# Patient Record
Sex: Female | Born: 1950 | Race: White | Hispanic: No | Marital: Married | Smoking: Never smoker
Health system: Southern US, Community
[De-identification: ages and names within clinical notes are randomized; demographics above are authoritative.]

## PROBLEM LIST (undated history)

## (undated) DIAGNOSIS — K219 Gastro-esophageal reflux disease without esophagitis: Secondary | ICD-10-CM

## (undated) DIAGNOSIS — I340 Nonrheumatic mitral (valve) insufficiency: Secondary | ICD-10-CM

## (undated) DIAGNOSIS — I493 Ventricular premature depolarization: Secondary | ICD-10-CM

## (undated) DIAGNOSIS — I071 Rheumatic tricuspid insufficiency: Secondary | ICD-10-CM

## (undated) DIAGNOSIS — H409 Unspecified glaucoma: Secondary | ICD-10-CM

## (undated) HISTORY — DX: Unspecified glaucoma: H40.9

## (undated) HISTORY — DX: Ventricular premature depolarization: I49.3

---

## 2004-09-05 ENCOUNTER — Ambulatory Visit: Payer: Self-pay | Admitting: General Surgery

## 2008-09-18 ENCOUNTER — Ambulatory Visit: Payer: Self-pay | Admitting: Internal Medicine

## 2010-05-08 ENCOUNTER — Ambulatory Visit: Payer: Self-pay | Admitting: Gastroenterology

## 2010-07-18 ENCOUNTER — Ambulatory Visit: Payer: Self-pay | Admitting: Gastroenterology

## 2010-07-22 LAB — PATHOLOGY REPORT

## 2012-05-17 ENCOUNTER — Ambulatory Visit: Payer: Self-pay | Admitting: Gastroenterology

## 2012-08-08 ENCOUNTER — Observation Stay: Payer: Self-pay | Admitting: Internal Medicine

## 2012-08-08 LAB — URINALYSIS, COMPLETE
Bacteria: NONE SEEN
Bilirubin,UR: NEGATIVE
Blood: NEGATIVE
Glucose,UR: NEGATIVE mg/dL
Ketone: NEGATIVE
Leukocyte Esterase: NEGATIVE
Nitrite: NEGATIVE
Ph: 9
Protein: NEGATIVE
RBC,UR: 4 /HPF
Specific Gravity: 1.014
Squamous Epithelial: <1
WBC UR: 1 /HPF

## 2012-08-08 LAB — BASIC METABOLIC PANEL
Anion Gap: 8 (ref 7–16)
Calcium, Total: 9.2 mg/dL (ref 8.5–10.1)
Chloride: 108 mmol/L — ABNORMAL HIGH (ref 98–107)
Co2: 27 mmol/L (ref 21–32)
EGFR (African American): >60
EGFR (Non-African Amer.): >60
Glucose: 98 mg/dL (ref 65–99)
Osmolality: 284 (ref 275–301)
Sodium: 143 mmol/L (ref 136–145)

## 2012-08-08 LAB — CBC WITH DIFFERENTIAL/PLATELET
Basophil #: 0 10*3/uL (ref 0.0–0.1)
Basophil %: 0.8 %
HCT: 40.4 % (ref 35.0–47.0)
HGB: 13.5 g/dL (ref 12.0–16.0)
MCH: 29.5 pg (ref 26.0–34.0)
Monocyte #: 0.3 x10 3/mm (ref 0.2–0.9)
Monocyte %: 5.6 %
Neutrophil #: 4.1 10*3/uL (ref 1.4–6.5)
RBC: 4.57 10*6/uL (ref 3.80–5.20)
RDW: 13 % (ref 11.5–14.5)
WBC: 6 10*3/uL (ref 3.6–11.0)

## 2012-08-08 LAB — CK TOTAL AND CKMB (NOT AT ARMC)
CK, Total: 76 U/L (ref 21–215)
CK, Total: 68 U/L
CK-MB: <0.5 ng/mL — ABNORMAL LOW (ref 0.5–3.6)
CK-MB: <0.5 ng/mL — ABNORMAL LOW

## 2012-08-08 LAB — TROPONIN I: Troponin-I: <0.02 ng/mL

## 2012-08-08 LAB — MAGNESIUM: Magnesium: 2 mg/dL

## 2012-08-08 LAB — TSH: Thyroid Stimulating Horm: 3.66 u[IU]/mL

## 2012-08-09 LAB — LIPID PANEL
Cholesterol: 195 mg/dL (ref 0–200)
HDL Cholesterol: 78 mg/dL — ABNORMAL HIGH (ref 40–60)
Ldl Cholesterol, Calc: 109 mg/dL — ABNORMAL HIGH (ref 0–100)
Triglycerides: 41 mg/dL (ref 0–200)
VLDL Cholesterol, Calc: 8 mg/dL (ref 5–40)

## 2012-08-09 LAB — CK TOTAL AND CKMB (NOT AT ARMC): CK, Total: 61 U/L (ref 21–215)

## 2012-10-13 ENCOUNTER — Ambulatory Visit: Payer: Self-pay | Admitting: Chiropractic Medicine

## 2014-07-16 ENCOUNTER — Encounter: Payer: Self-pay | Admitting: Podiatry

## 2014-07-16 ENCOUNTER — Ambulatory Visit (INDEPENDENT_AMBULATORY_CARE_PROVIDER_SITE_OTHER): Payer: BC Managed Care – PPO | Admitting: Podiatry

## 2014-07-16 VITALS — BP 125/73 | HR 86 | Resp 16 | Ht 63.0 in | Wt 145.0 lb

## 2014-07-16 DIAGNOSIS — L603 Nail dystrophy: Secondary | ICD-10-CM

## 2014-07-16 NOTE — Progress Notes (Signed)
   Subjective:    Patient ID: Courtney Galloway, female    DOB: May 12, 1951, 64 y.o.   MRN: [id number]  HPI Comments: i have a fungus on my big toe left foot. This has been going on for 4 - 6 weeks. i want him to look at my other toes as well. Once in a while the big toe will hurt if you mash on it. i soaked in epsom salt. i took lamisil by dr Al Corpushyatt yrs ago for the same toe.     Review of Systems  HENT:       Ringing in ears  Skin:       Change in nails  All other systems reviewed and are negative.      Objective:   Physical Exam: I have reviewed her past mental history medications allergies surgery social history and review of systems. Pulses are strongly palpable bilateral. Neurologic sensorium are intact per Semmes-Weinstein monofilament. Deep tendon reflexes are intact bilaterally muscle strength is 5 over 5 dorsiflexion plantar flexors and inverters everters all intrinsic musculature is intact. Orthopedic evaluation demonstrates all joints distal to the ankle for range of motion without crepitation. Cutaneous evaluation demonstrates supple well-hydrated cutis left foot with exception of mild tinea pedis. She also demonstrates a yellow green discoloration of the hallux nail plate with distal onychomycosis left hallux. Onychodystrophy second digit left foot is also noted.        Assessment & Plan:  Assessment: Tinea pedis with possible onychomycosis and onychodystrophy.  Plan: Samples of nails and skin were taken today for pathologic evaluation we will notify her once the results are obtained.

## 2014-07-25 ENCOUNTER — Encounter: Payer: Self-pay | Admitting: Podiatry

## 2014-07-25 ENCOUNTER — Telehealth: Payer: Self-pay | Admitting: Podiatry

## 2014-07-25 NOTE — Telephone Encounter (Signed)
CALLED AND LEFT MESSAGE FOR PATIENT TO CALL BACK TO SET UP APPT WITH DR.HYATT TO GO OVER PATHOLOGY RESULTS

## 2014-08-01 ENCOUNTER — Ambulatory Visit (INDEPENDENT_AMBULATORY_CARE_PROVIDER_SITE_OTHER): Payer: BC Managed Care – PPO | Admitting: Podiatry

## 2014-08-01 VITALS — BP 106/66 | HR 85 | Resp 16

## 2014-08-01 DIAGNOSIS — Z79899 Other long term (current) drug therapy: Secondary | ICD-10-CM

## 2014-08-01 MED ORDER — TERBINAFINE HCL 250 MG PO TABS: 250.0000 mg | ORAL_TABLET | Freq: Every day | ORAL | Status: DC

## 2014-08-02 LAB — CBC WITH DIFFERENTIAL/PLATELET
BASOS ABS: 0.1 10*3/uL (ref 0.0–0.2)
Basos: 1 %
EOS: 3 %
Eosinophils Absolute: 0.2 10*3/uL (ref 0.0–0.4)
HEMATOCRIT: 39.6 % (ref 34.0–46.6)
Hemoglobin: 13.3 g/dL (ref 11.1–15.9)
Immature Grans (Abs): 0 10*3/uL (ref 0.0–0.1)
Immature Granulocytes: 0 %
LYMPHS: 32 %
Lymphocytes Absolute: 2 10*3/uL (ref 0.7–3.1)
MCH: 28.9 pg (ref 26.6–33.0)
MCHC: 33.6 g/dL (ref 31.5–35.7)
MCV: 86 fL (ref 79–97)
MONOCYTES: 9 %
MONOS ABS: 0.5 10*3/uL (ref 0.1–0.9)
Neutrophils Absolute: 3.4 10*3/uL (ref 1.4–7.0)
Neutrophils Relative %: 55 %
Platelets: 372 10*3/uL (ref 150–379)
RBC: 4.6 x10E6/uL (ref 3.77–5.28)
RDW: 13.6 % (ref 12.3–15.4)
WBC: 6.2 10*3/uL (ref 3.4–10.8)

## 2014-08-02 LAB — HEPATIC FUNCTION PANEL
ALT: 30 IU/L (ref 0–32)
AST: 26 IU/L (ref 0–40)
Albumin: 4.6 g/dL (ref 3.6–4.8)
Alkaline Phosphatase: 73 IU/L (ref 39–117)
BILIRUBIN DIRECT: 0.09 mg/dL (ref 0.00–0.40)
Total Bilirubin: 0.3 mg/dL (ref 0.0–1.2)
Total Protein: 6.9 g/dL (ref 6.0–8.5)

## 2014-08-02 NOTE — Progress Notes (Signed)
She presents today for positive pathology report for onychomycosis. At this point she would like to try oral therapy.   Objective: Onychomycosis.  Assessment: Onychomycosis.  Plan: Started her on oral Lamisil therapy today after thorough discussion of pros and cons of the use of this medication. Lamisil 250 mg tablets 30 one by mouth daily and a liver profile was requested. Should this come back abnormal we will notify her immediately otherwise I will follow up with her in a little more than 1 month.

## 2014-08-06 ENCOUNTER — Telehealth: Payer: Self-pay | Admitting: *Deleted

## 2014-08-06 NOTE — Telephone Encounter (Signed)
Blood work good to continue with medication .

## 2014-08-06 NOTE — Telephone Encounter (Signed)
-----   Message from Elinor ParkinsonMax T Hyatt, [place]PM sent at 08/04/2014 10:23 AM EST ----- Blood work looks good and may continue medication.

## 2014-09-10 ENCOUNTER — Encounter: Payer: Self-pay | Admitting: Podiatry

## 2014-09-10 ENCOUNTER — Ambulatory Visit (INDEPENDENT_AMBULATORY_CARE_PROVIDER_SITE_OTHER): Payer: BC Managed Care – PPO | Admitting: Podiatry

## 2014-09-10 DIAGNOSIS — L603 Nail dystrophy: Secondary | ICD-10-CM

## 2014-09-10 DIAGNOSIS — Z79899 Other long term (current) drug therapy: Secondary | ICD-10-CM

## 2014-09-10 MED ORDER — TERBINAFINE HCL 250 MG PO TABS: 250.0000 mg | ORAL_TABLET | Freq: Every day | ORAL | Status: DC

## 2014-09-10 NOTE — Progress Notes (Signed)
She presents today one month status post her initial start of Lamisil. Her initial labs were very good she brings with her a second set of labs that were just performed over the last couple of days. She denies fever chills nausea vomiting muscle aches pains rashes or itching with the Lamisil.  Objective: No change in the nail plates as of yet.  Assessment: Onychomycosis long-term use of Lamisil for treatment plan.  Plan: She will continue Lamisil for another 90 days. I will follow up with her in 4 months

## 2014-10-18 LAB — HM COLONOSCOPY

## 2014-10-26 NOTE — Discharge Summary (Signed)
PATIENT NAME:  Courtney AbideWILLIAMS, Anarely N MR#:  [id number] DATE OF BIRTH:  04-08-1951  DATE OF ADMISSION:  08/08/2012 DATE OF DISCHARGE:  08/09/2012  PRIMARY CARE PHYSICIAN:  Katherina Rightenny C. Arlana Pouchate, MD  DISCHARGE DIAGNOSES:   1.  Musculoskeletal chest pain.  2.  Palpitations.   CONSULTS:  Dr. Darrold JunkerParaschos of cardiology.   IMAGING STUDIES DONE:   1.  Include chest x-ray which showed no acute abnormalities.  2.  Myocardial stress test which showed normal left ventricular function with normal wall motion. No signs of ischemia. Ejection fraction is 25%.   ADMITTING HISTORY AND PHYSICAL AND HOSPITAL COURSE:  A 64 year old female patient who has GERD and osteoporosis being worked up as outpatient for some palpitations over a month. Developed left-sided pain and tingling and was admitted to the hospitalist service for concern of acute coronary syndrome. The patient had 3 sets of negative cardiac enzymes and had a stress test done which was negative. She was seen by Dr. Darrold JunkerParaschos who has advised followup as outpatient. The patient has not had any palpitations in the hospital. A recent Holter monitor ran as an outpatient was normal as per the patient.   On the day prior to discharge, the patient's temperature was 98.9, pulse 74, blood pressure 130/74 and is being discharged home in fair condition to follow up with Dr. Darrold JunkerParaschos and primary care physician.   DISCHARGE MEDICATIONS:   1.  Protonix 40 mg oral once a day.  2.  Boniva 150 mg oral once a month.  3.  Multivitamin 1 tablet oral once a day.  4.  Fish 2 capsules oral once a day.  5.  Calcium carbonate 1 tablet oral once a day.  6.  Aspirin 81 mg oral once a day.   DISCHARGE INSTRUCTIONS:  The patient is being discharged home on a regular diet, activity as tolerated, follow up with Dr. Darrold JunkerParaschos in 2 to 4 weeks and Dr. Arlana Pouchate in a week.   TIME SPENT ON DAY OF DISCHARGE IN DISCHARGE ACTIVITY:  25 minutes.    ____________________________ Molinda BailiffSrikar R. Dray Dente,  MD srs:si D: 08/09/2012 15:17:00 ET T: 08/09/2012 16:41:36 ET JOB#: [id number]  cc: Wardell HeathSrikar R. Abeeha Twist, MD, <Dictator> Jillene Bucksenny C. Arlana Pouchate, MD Marcina MillardAlexander Paraschos, MD  Orie FishermanSRIKAR R Dustie Brittle MD ELECTRONICALLY SIGNED 08/11/2012 13:27

## 2014-10-26 NOTE — Consult Note (Signed)
PATIENT NAME:  Courtney Galloway, Courtney Galloway MR#:  [id number] DATE OF BIRTH:  [birth year]  DATE OF CONSULTATION:  08/09/2012  REFERRING PHYSICIAN:  Auburn BilberryShreyang Patel, MD   CONSULTING PHYSICIAN:  Marcina MillardAlexander Cashtyn Pouliot, MD PRIMARY CARE PHYSICIAN: Dewaine Oatsenny Tate, MD   CHIEF COMPLAINT: Chest discomfort.   REASON FOR CONSULTATION: Requested for evaluation of chest pain.   HISTORY OF PRESENT ILLNESS: The patient is a 64 year old female who is referred for evaluation of chest discomfort. The patient reports a 6660-month history of apparent palpitations that typically last 30 to 45 minutes. The patient recently was referred to Dr. Lady GaryFath for Holter monitor, which reportedly was negative. The patient had persistent symptoms with associated chest discomfort and [facility] 08[facility]08/2012. EKG was negative. The patient ruled out for myocardial infarction by CPK isoenzymes and troponin.   PAST MEDICAL HISTORY: 1. Gastroesophageal reflux disease.  2. Osteoporosis.   MEDICATIONS: Protonix 40 mg daily, Boniva 150 mg daily, calcium carbonate 1000 mg daily, fish oil caps 2 caps daily.   SOCIAL HISTORY: The patient denies tobacco or EtOH abuse.   FAMILY HISTORY: No immediate family history of coronary artery disease or myocardial infarction.   REVIEW OF SYSTEMS:  CONSTITUTIONAL: No fever or chills.  EYES: No blurry vision.  EARS: No hearing loss.  RESPIRATORY: No shortness of breath.  CARDIOVASCULAR: Chest discomfort and palpitations as described above.  GASTROINTESTINAL: No nausea, vomiting, diarrhea or constipation.  GU: No dysuria or hematuria.  ENDOCRINE: No polyuria or polydipsia.  INTEGUMENTARY: No rash.  MUSCULOSKELETAL: No arthralgias or myalgias.  NEUROLOGICAL: No focal muscle weakness or numbness.  PSYCHOLOGICAL: No depression or anxiety.   PHYSICAL EXAMINATION: VITAL SIGNS: Blood pressure 145/69, pulse 78, respirations 16, temperature 96.8, pulse ox 99%.  HEENT: Pupils are equal and reactive  to light and accommodation.  NECK: Supple without thyromegaly.  LUNGS: Clear.  HEART: Normal JVP. Normal PMI. Regular rate and rhythm. Normal S1, S2. No appreciable gallop, murmur, or rub.  ABDOMEN: Soft and nontender. Pulses were intact bilaterally.  MUSCULOSKELETAL: Normal muscle tone.  NEUROLOGICAL: The patient is alert and oriented x 3. Motor and sensory are both grossly intact.   IMPRESSION: The patient is a 64 year old female with a 7260-month history of palpitations and recent history of chest discomfort with typical and atypical features. The patient has ruled out for myocardial infarction by CPK isoenzymes and troponin.   RECOMMENDATIONS: 1. I agree with overall current therapy.  2. I would defer full-dose anticoagulation.  3. I agree with ETT sestamibi study for today.   Further recommendations pending sestamibi results.   ____________________________ Marcina MillardAlexander Zoelle Markus, MD ap:cb D: 08/09/2012 12:36:02 ET T: 08/09/2012 12:47:49 ET JOB#: [id number]  cc: Marcina MillardAlexander Prisca Gearing, MD, <Dictator> Marcina MillardALEXANDER Cher Franzoni MD ELECTRONICALLY SIGNED 08/30/2012 14:57

## 2014-10-26 NOTE — H&P (Signed)
PATIENT NAME:  Courtney Galloway, [name] MR#:  [id number] DATE OF BIRTH:  01-10-1951  DATE OF ADMISSION:  08/08/2012  PRIMARY CARE PHYSICIAN: Katherina Rightenny C. Arlana Pouchate, MD  ED REFERRING PHYSICIAN: Darien Ramusavid W. Kaminski, MD  REASON FOR ADMISSION:  Chest pain, palpitations.     HISTORY OF PRESENT ILLNESS: The patient is a 64 year old white female with history of GERD and osteoporosis, who has been has been relatively healthy, who has been having heart palpitations for the past 1 month. She was seen by Dr. Arlana Pouchate for this and was referred to Dr. Lady GaryFath and had a Holter monitor placed, which apparently was negative. The patient continued to have these symptoms. She reports they usually last 1/2 hour, sometimes they last 1/2 hour to 45 minutes. It usually occurs at night time. She reports that she continued to have these symptoms, did not have any shortness of breath and did not have chest pain, but today she started having left arm pain and then started having numbness and tingling in the left arm. She also had a brief episode of substernal chest pain; therefore,  came to the ED. In the ED, she was noted to have a normal EKG that was unrevealing and her cardiac enzymes were negative. The patient currently is asymptomatic. She otherwise denies any fevers, chills, no shortness of breath.   PAST MEDICAL HISTORY: 1.  GERD.  2.  Osteoporosis.   PAST SURGICAL HISTORY: 1.  Status post hysterectomy.  2.  Tonsillectomy.   ALLERGIES: TO AMOXICILLIN.   CURRENT MEDICATIONS: She is on Protonix 40 daily, Boniva 150 q. monthly, calcium carbonate 1000 mg daily, fish oil 2 caps daily, multivitamin daily.   SOCIAL HISTORY: Does not smoke. Does not drink. No drugs.   FAMILY HISTORY: Father with CVA and pancreatic cancer.   REVIEW OF SYSTEMS:   CONSTITUTIONAL: Denies any fevers, chills. No weight loss. No weight gain.  EYES: Denies any blurred vision, double vision. No glaucoma. No cataracts.  ENT: Denies any nasal drainage. No  seasonal allergies.  OROPHARYNX:  Denies any difficulty with swallowing. No lesions in the mouth.  CARDIOVASCULAR: Palpitations as above. No syncope. No coronary artery disease. No history of hypertension.  PULMONARY: Denies any cough, wheezing. No hemoptysis. No COPD, no tuberculosis.  GASTROINTESTINAL: Denies any nausea, vomiting, diarrhea. Denies any hematemesis, hematochezia.  GENITOURINARY: Denies any frequency, urgency, or hesitancy.  MUSCULOSKELETAL: Denies any joint swelling. No gout.  NEUROLOGIC: Denies any numbness, CVA, TIA.  PSYCHIATRIC: Denies any anxiety, insomnia. No depression.  LYMPHATICS: Denies any lymph node enlargements.  NEUROLOGIC: Denies any CVA, TIA or seizures.   PHYSICAL EXAMINATION: VITAL SIGNS: Temperature 98.9, pulse 77, respirations 20, blood pressure 181/95.  GENERAL: The patient is awake, alert, oriented x3. No focal deficits. Cranial nerves II through XII grossly intact.  NOSE:  Nasal exam shows no drainage or ulceration.  OROPHARYNX: Clear without any exudate.  NECK: No thyromegaly. No carotid bruits.  CARDIOVASCULAR: Regular rate and rhythm. No murmurs, rubs, clicks, or gallops. PMI is not displaced.  LUNGS: Clear to auscultation bilaterally without any rales, rhonchi or wheezing.  ABDOMEN: Soft, nontender, nondistended. Positive bowel sounds x4.  EXTREMITIES: No clubbing, cyanosis, or edema.  SKIN: No rash.  LYMPHATICS: No lymph nodes palpable.  VASCULAR: Good DP, PT pulses.  PSYCHIATRIC: Not anxious or depressed.   LABORATORY, DIAGNOSTIC, AND RADIOLOGICAL DATA: BMP: Glucose 98, BUN 10, creatinine 0.69, sodium 143, potassium 4.1, chloride 108, CO2 27, magnesium 2.0. CPK 76. CK-MB less than 0.5. Troponin less than 0.02.  TSH 3.66, WBC 6.0, hemoglobin 13.5, platelet count 282.   ASSESSMENT AND PLAN: The patient is a 64 year old white female with history of gastroesophageal reflux disease, osteoporosis. She has been having palpitations for past 1 month.  Today  had left arm pain, chest pressure.  1.  Chest pain, left arm pain, atypical symptoms.  At this time, cardiac enzymes are negative. We will follow serial cardiac enzymes, place her on, aspirin, we will place her on observation. We will go ahead and perform a stress test in the morning.  2.  Palpitations, status post 24 hour Holter monitor, which is negative. We will ask cardiology to see. May need a 30-day event monitor. Her TSH is normal. We will monitor on telemetry here to see if we see any type of arrhythmias.  3.  Elevated blood pressure noted initially here and now is improved. We will monitor her blood pressure, if it continues to be elevated, may benefit from a beta blocker.  4.  Gastroesophageal reflux disease. We will continue proton pump inhibitor.  5.  Miscellaneous. The patient is ambulatory.   TIME SPENT: 35 minutes spent.    ____________________________ Lacie Scotts. Allena Katz, MD shp:cc D: 08/08/2012 15:23:13 ET T: 08/08/2012 16:51:55 ET JOB#: [id number]  cc: Lakiesha Ralphs H. Allena Katz, MD, <Dictator> Jillene Bucks. Arlana Pouch, MD  Charise Carwin MD ELECTRONICALLY SIGNED 08/13/2012 8:30

## 2014-11-01 ENCOUNTER — Ambulatory Visit
Admit: 2014-11-01 | Disposition: A | Discharge status: Home / Self Care | Payer: Self-pay | Attending: Gastroenterology | Admitting: Gastroenterology

## 2015-01-16 ENCOUNTER — Ambulatory Visit: Payer: BC Managed Care – PPO | Admitting: Podiatry

## 2015-01-28 ENCOUNTER — Ambulatory Visit (INDEPENDENT_AMBULATORY_CARE_PROVIDER_SITE_OTHER): Payer: BC Managed Care – PPO | Admitting: Podiatry

## 2015-01-28 VITALS — BP 123/72 | HR 68 | Resp 16

## 2015-01-28 DIAGNOSIS — Z79899 Other long term (current) drug therapy: Secondary | ICD-10-CM | POA: Diagnosis not present

## 2015-01-28 DIAGNOSIS — L603 Nail dystrophy: Secondary | ICD-10-CM

## 2015-01-28 MED ORDER — TERBINAFINE HCL 250 MG PO TABS: 250.0000 mg | ORAL_TABLET | Freq: Every day | ORAL | Status: DC

## 2015-01-28 NOTE — Progress Notes (Signed)
She presents today for follow up of fungal toenails. She has completed 90 days of Lamisil therapy. She denies fever chills nausea vomiting muscle aches pains rashes or itching with the Lamisil.  Objective: Some improvements in the nail plate.  Assessment: Onychomycosis long-term use of Lamisil for treatment plan.  Plan: She will continue Lamisil for another 60 days one tablet every other day.

## 2015-03-13 ENCOUNTER — Other Ambulatory Visit: Payer: Self-pay | Admitting: Internal Medicine

## 2015-03-13 DIAGNOSIS — M8580 Other specified disorders of bone density and structure, unspecified site: Secondary | ICD-10-CM

## 2015-03-25 ENCOUNTER — Ambulatory Visit
Admission: RE | Admit: 2015-03-25 | Discharge: 2015-03-25 | Disposition: A | Discharge status: Home / Self Care | Payer: BC Managed Care – PPO | Source: Ambulatory Visit | Attending: Internal Medicine | Admitting: Internal Medicine

## 2015-03-25 DIAGNOSIS — M8580 Other specified disorders of bone density and structure, unspecified site: Secondary | ICD-10-CM | POA: Insufficient documentation

## 2015-05-06 ENCOUNTER — Encounter: Payer: Self-pay | Admitting: Podiatry

## 2015-05-06 ENCOUNTER — Ambulatory Visit (INDEPENDENT_AMBULATORY_CARE_PROVIDER_SITE_OTHER): Payer: BC Managed Care – PPO | Admitting: Podiatry

## 2015-05-06 VITALS — BP 127/69 | HR 79 | Resp 16

## 2015-05-06 DIAGNOSIS — L603 Nail dystrophy: Secondary | ICD-10-CM

## 2015-05-06 DIAGNOSIS — Z79899 Other long term (current) drug therapy: Secondary | ICD-10-CM

## 2015-05-06 NOTE — Progress Notes (Signed)
She presents today for long-term therapy use of Lamisil for onychomycosis hallux and second digit left foot. She states that it really doesn't seem to be improving very rapidly at all. The discoloration has subsided some but the nail is still thick and slightly discolored. I do not want to take medication anymore she states.  Objective: Vital signs are stable she is alert and oriented 3. Pulses are palpable. Nail dystrophy to the hallux and second digit left foot with some onychomycosis remaining.  Assessment: Onychomycosis nail dystrophy hallux and second digit left.  Plan: Discussed etiology pathology conservative versus surgical therapies. At this point I encouraged laser therapy for the hallux and second digit. She agreed to this and we will follow-up with her in near future for laser therapy.  Arbutus Pedodd Simranjit Thayer DPM

## 2015-05-21 ENCOUNTER — Ambulatory Visit (INDEPENDENT_AMBULATORY_CARE_PROVIDER_SITE_OTHER): Payer: BC Managed Care – PPO | Admitting: Podiatry

## 2015-05-21 DIAGNOSIS — L603 Nail dystrophy: Secondary | ICD-10-CM

## 2015-05-21 NOTE — Progress Notes (Signed)
She presents today for her first laser therapy for her onychomycosis hallux and second digit left foot.  Objective: Vital signs are stable she is alert and oriented 3. Pulses are palpable. Nail dystrophy with onychomycosis hallux and second digit left.  Assessment: Onychomycosis.  Plan: Presented today for her first laser and she tolerated this procedure well today without iatrogenic lesions or complications. I will follow-up with her in 2 months for her next laser therapy.

## 2015-07-23 ENCOUNTER — Ambulatory Visit (INDEPENDENT_AMBULATORY_CARE_PROVIDER_SITE_OTHER): Payer: BC Managed Care – PPO | Admitting: Podiatry

## 2015-07-23 DIAGNOSIS — L603 Nail dystrophy: Secondary | ICD-10-CM

## 2015-07-23 NOTE — Progress Notes (Signed)
She presents today for her second laser therapy on toes #1 and #2 of her left foot. She states that the second toe really doesn't seem to be doing much however the hallux does appear to be changing at least to some degree.  Objective: Slowly resolving onychomycosis hallux and second digit nail plate left.  Assessment: Healing onychomycosis.  Plan: Laser therapy tolerated well today to nail plate #1 and number to the left foot. Follow up with her in 3 months.

## 2015-10-29 ENCOUNTER — Ambulatory Visit (INDEPENDENT_AMBULATORY_CARE_PROVIDER_SITE_OTHER): Payer: BC Managed Care – PPO | Admitting: Podiatry

## 2015-10-29 DIAGNOSIS — L603 Nail dystrophy: Secondary | ICD-10-CM

## 2015-10-29 NOTE — Progress Notes (Signed)
She presents today for follow-up of her hallux and second digit of the left foot. She presents for her third laser treatment. The hallux nail has improved considerably she says that the second toe has not.  Objective: Vital signs are stable she is alert and oriented 3 hallux nail demonstrates approximately 50% improvement however the second nail plate appears to be dystrophic and is not responding to therapy.  Assessment: Resolving onychomycosis hallux left and to a less degree second digit left foot.  Plan: Follow up with her in 3 months for possible touchup no charge at that point.

## 2016-01-23 ENCOUNTER — Ambulatory Visit: Payer: BC Managed Care – PPO | Admitting: Podiatry

## 2016-01-28 ENCOUNTER — Other Ambulatory Visit: Payer: BC Managed Care – PPO

## 2016-02-18 ENCOUNTER — Other Ambulatory Visit: Payer: BC Managed Care – PPO

## 2016-02-25 ENCOUNTER — Ambulatory Visit: Payer: BC Managed Care – PPO

## 2016-02-25 DIAGNOSIS — L603 Nail dystrophy: Secondary | ICD-10-CM

## 2016-02-25 NOTE — Progress Notes (Signed)
She presents today for follow-up of her hallux and second digit of the left foot. She presents for her fourth laser treatment. The hallux nail has improved considerably she says that the second toe has not.  Objective: Vital signs are stable she is alert and oriented 3 hallux nail demonstrates approximately 50% improvement however the second nail plate appears to be dystrophic and is not responding to therapy.  Assessment: Resolving onychomycosis hallux left and to a less degree second digit left foot.  Plan: Follow up with as needed

## 2017-12-29 ENCOUNTER — Other Ambulatory Visit: Payer: Self-pay | Admitting: Gastroenterology

## 2017-12-29 DIAGNOSIS — R1013 Epigastric pain: Secondary | ICD-10-CM

## 2018-01-07 ENCOUNTER — Ambulatory Visit
Admission: RE | Admit: 2018-01-07 | Discharge: 2018-01-07 | Disposition: A | Discharge status: Home / Self Care | Payer: Medicare Other | Source: Ambulatory Visit | Attending: Gastroenterology | Admitting: Gastroenterology

## 2018-01-07 DIAGNOSIS — K219 Gastro-esophageal reflux disease without esophagitis: Secondary | ICD-10-CM | POA: Insufficient documentation

## 2018-01-07 DIAGNOSIS — R1013 Epigastric pain: Secondary | ICD-10-CM

## 2018-01-07 DIAGNOSIS — A048 Other specified bacterial intestinal infections: Secondary | ICD-10-CM | POA: Diagnosis not present

## 2018-01-07 DIAGNOSIS — I700 Atherosclerosis of aorta: Secondary | ICD-10-CM | POA: Insufficient documentation

## 2018-01-07 LAB — POCT I-STAT CREATININE: CREATININE: 0.8 mg/dL (ref 0.44–1.00)

## 2018-01-07 MED ORDER — IOPAMIDOL (ISOVUE-300) INJECTION 61%
100.0000 mL | Freq: Once | INTRAVENOUS | Status: AC | PRN
  Administered 2018-01-07: 100 mL via INTRAVENOUS

## 2018-09-07 IMAGING — CT CT ABD-PELV W/ CM
2 of 5 series · 16 of 46 positions shown, 18 images · IV contrast (iopamidol)
Comparison: None.

CLINICAL DATA: Abdominal pain.

EXAM:
CT ABDOMEN AND PELVIS WITH CONTRAST
TECHNIQUE: Multidetector CT imaging of the abdomen and pelvis was performed
using the standard protocol following bolus administration of
intravenous contrast.
CONTRAST:  100mL W3MH5F-7PP IOPAMIDOL (W3MH5F-7PP) INJECTION 61%

[Series 2: abd pelvis · axial · 0.62mm/px · z∈[-1520,-1160]mm · 13 of 86 slices shown, 15 images (1 of 2)]
[im 7/86  soft-tissue]
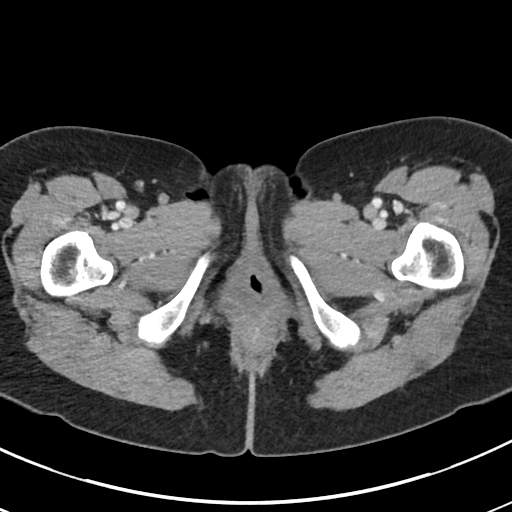
[im 7/86  bone]
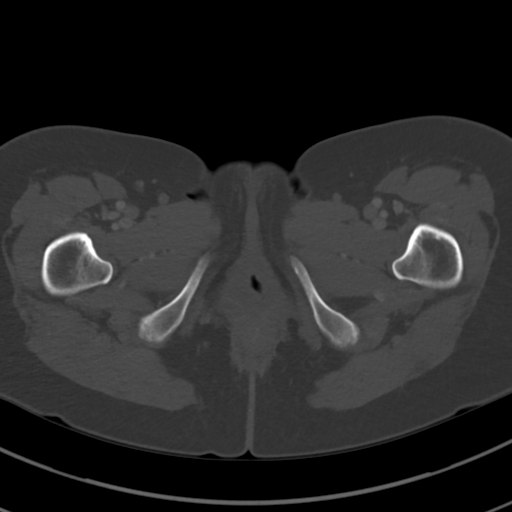
[im 13/86  soft-tissue]
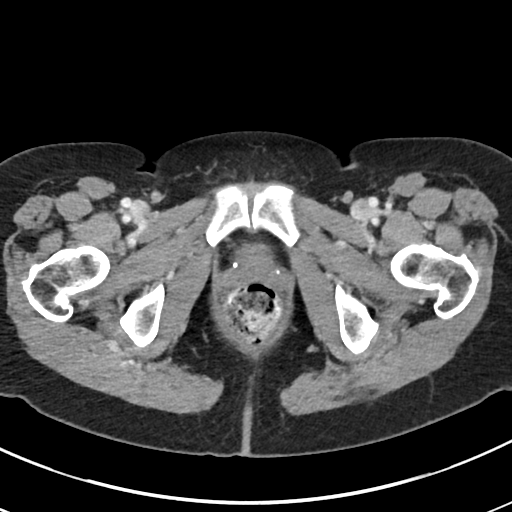
[im 19/86  soft-tissue]
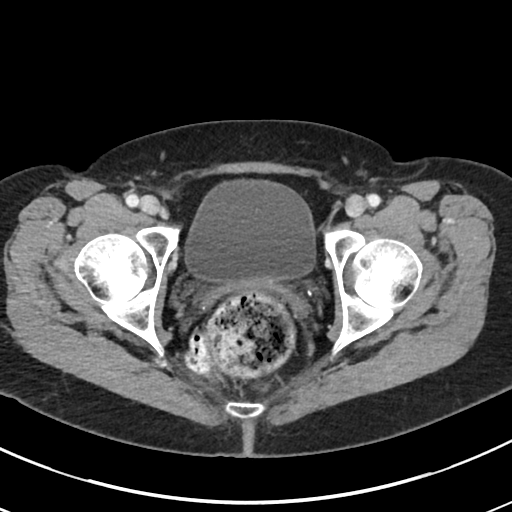
[im 25/86  soft-tissue]
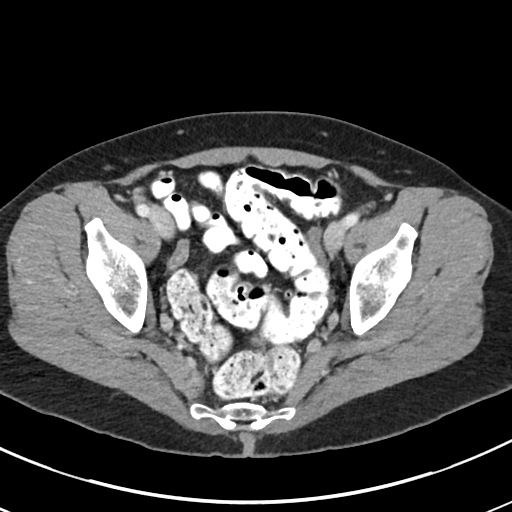
[im 31/86  soft-tissue]
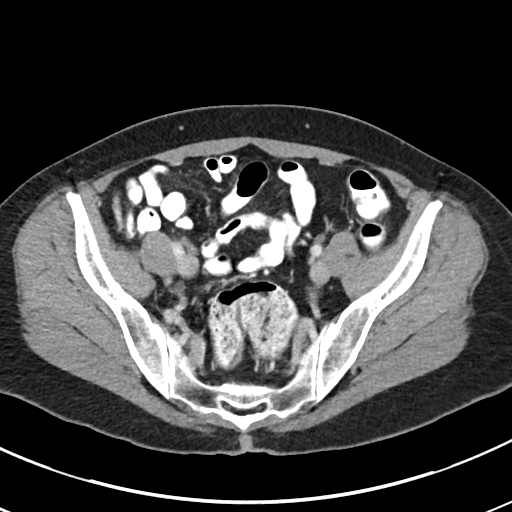
[im 37/86  soft-tissue]
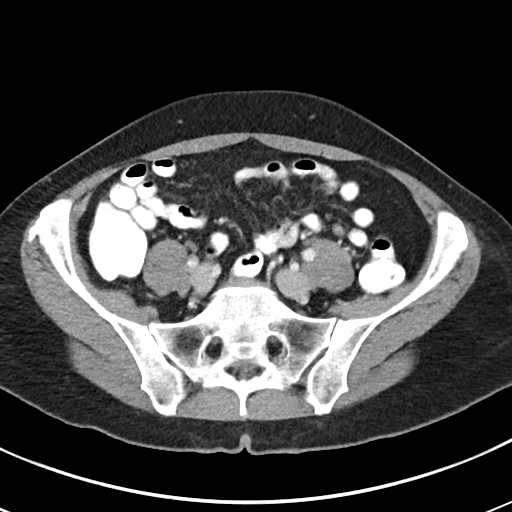
[im 43/86  soft-tissue]
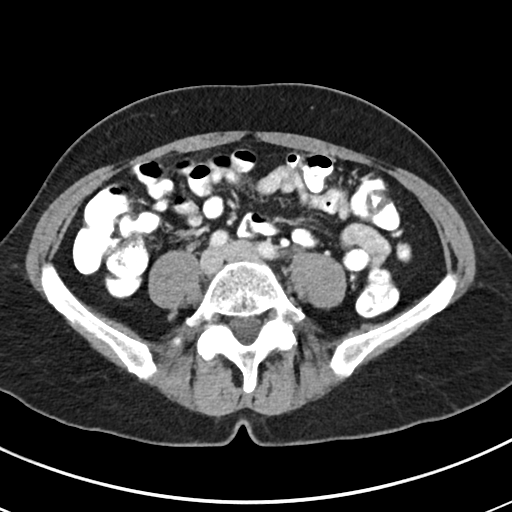
[im 49/86  soft-tissue]
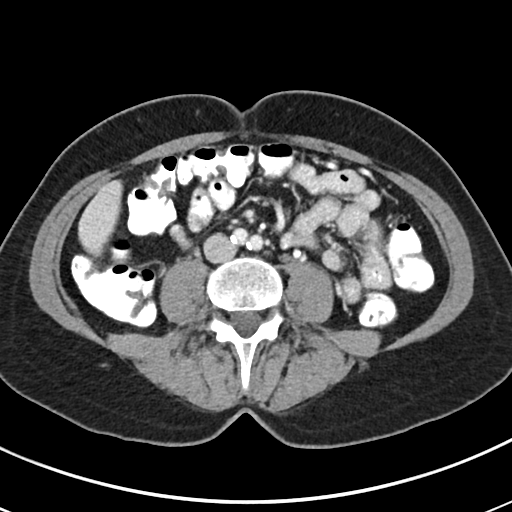
[im 55/86  soft-tissue]
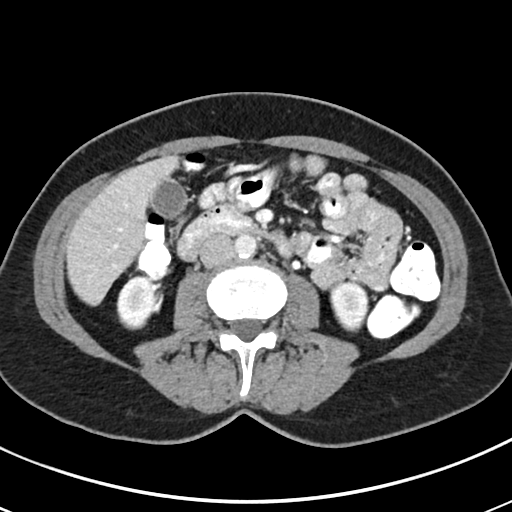
[im 55/86  bone]
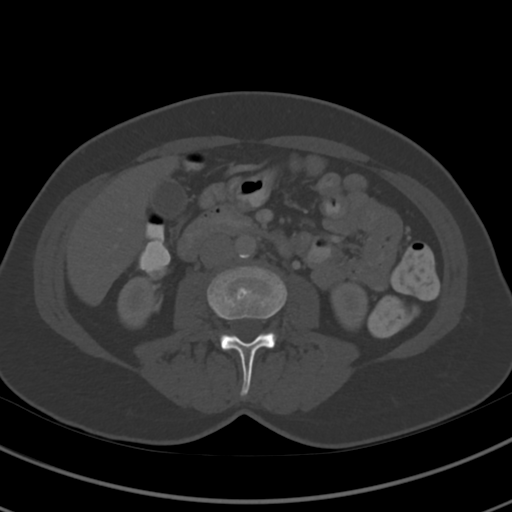
[im 61/86  soft-tissue]
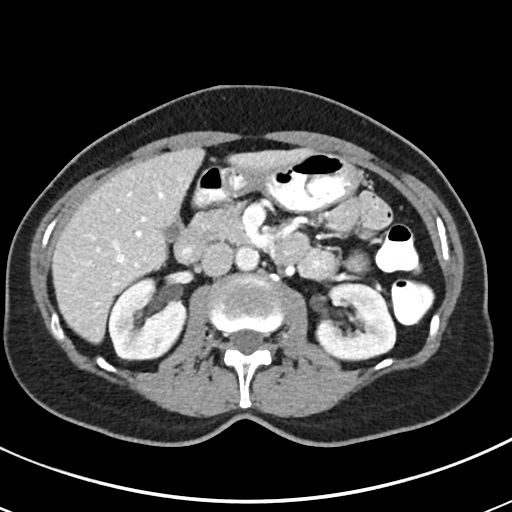
[im 67/86  soft-tissue]
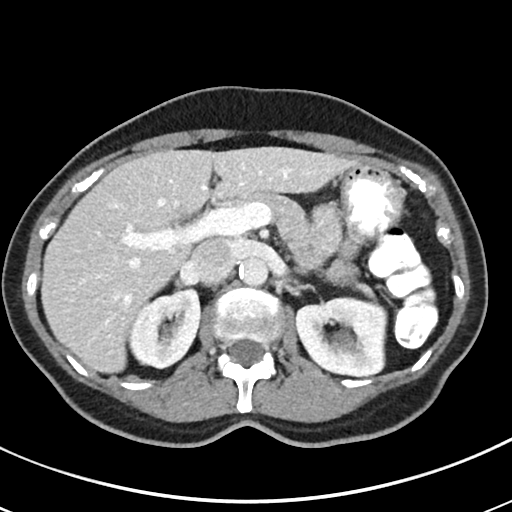
[im 73/86  soft-tissue]
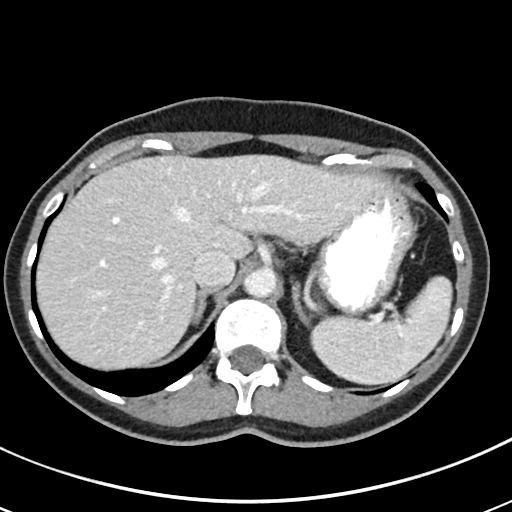
[im 79/86  soft-tissue]
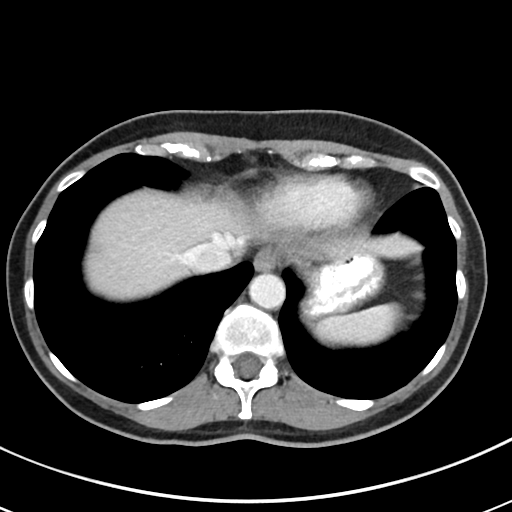

[Series 4: abd pelvis · coronal · 0.62mm/px · 3 of 123 slices shown (2 of 2)]
[im 41/123  soft-tissue]
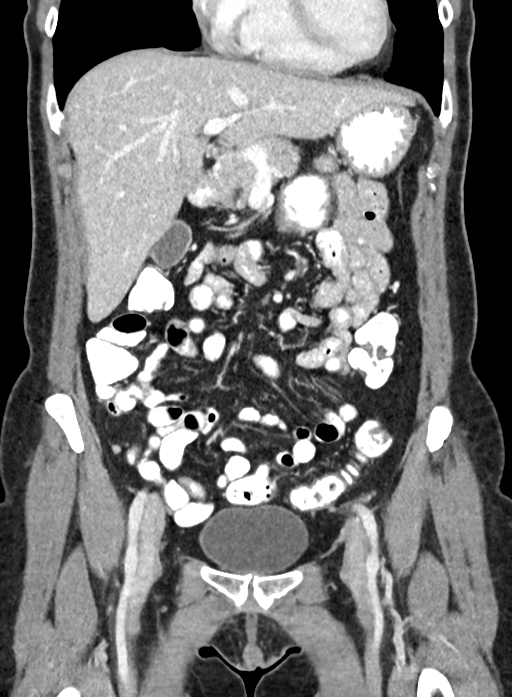
[im 55/123  soft-tissue]
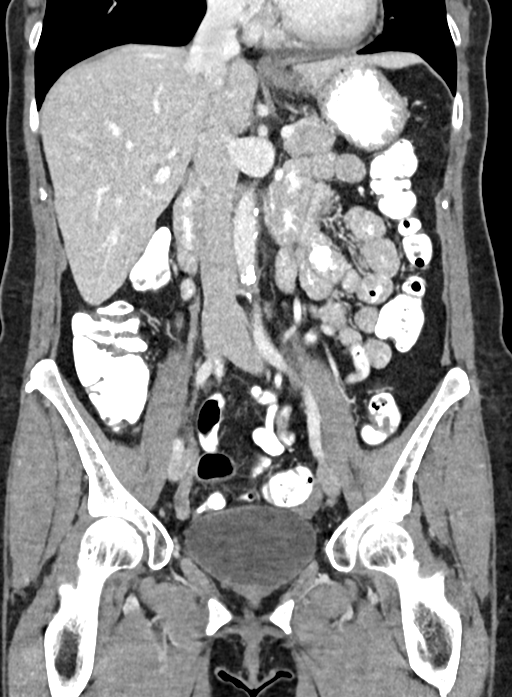
[im 68/123  soft-tissue]
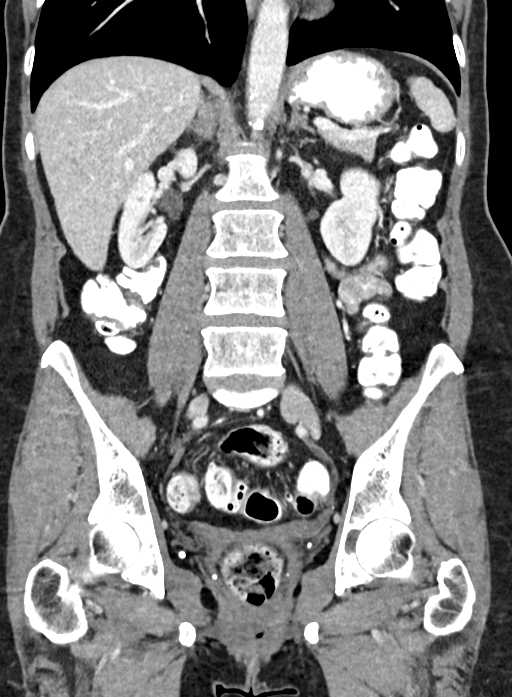

[16 of 46 positions shown; findings below may reference images not displayed]

FINDINGS: Lower chest: No acute abnormality.

Hepatobiliary: No focal liver abnormality is seen. No gallstones,
gallbladder wall thickening, or biliary dilatation.

Pancreas: Unremarkable. No pancreatic ductal dilatation or
surrounding inflammatory changes.

Spleen: Normal in size without focal abnormality.

Adrenals/Urinary Tract: Adrenal glands are unremarkable. Kidneys are
normal, without renal calculi, focal lesion, or hydronephrosis.
Bladder is unremarkable.

Stomach/Bowel: The stomach and small bowel are normal. There is
fecal loading in the sigmoid colon and rectum. The colon is
otherwise normal. The appendix is unremarkable.

Vascular/Lymphatic: Atherosclerotic changes are seen in the
nonaneurysmal aorta. No adenopathy.

Reproductive: Status post hysterectomy. No adnexal masses.

Other: No abdominal wall hernia or abnormality. No abdominopelvic
ascites.

Musculoskeletal: No acute or significant osseous findings.
IMPRESSION: 1. No cause for the patient's recent symptoms identified.
2. Fecal loading in the sigmoid colon and rectum.
3. Atherosclerotic changes in the nonaneurysmal aorta.

## 2018-12-29 ENCOUNTER — Encounter: Admission: RE | Payer: Self-pay | Source: Home / Self Care

## 2018-12-29 ENCOUNTER — Ambulatory Visit: Admission: RE | Admit: 2018-12-29 | Payer: Medicare Other | Source: Home / Self Care | Admitting: Gastroenterology

## 2018-12-29 SURGERY — ESOPHAGOGASTRODUODENOSCOPY (EGD) WITH PROPOFOL
Anesthesia: General

## 2019-01-27 ENCOUNTER — Other Ambulatory Visit: Payer: Medicare Other

## 2019-04-03 ENCOUNTER — Other Ambulatory Visit: Payer: Self-pay

## 2019-04-03 DIAGNOSIS — Z20822 Contact with and (suspected) exposure to covid-19: Secondary | ICD-10-CM

## 2019-04-04 LAB — SPECIMEN STATUS REPORT

## 2019-04-04 LAB — NOVEL CORONAVIRUS, NAA: SARS-CoV-2, NAA: NOT DETECTED

## 2019-04-11 ENCOUNTER — Other Ambulatory Visit: Payer: Self-pay

## 2019-04-11 DIAGNOSIS — Z20822 Contact with and (suspected) exposure to covid-19: Secondary | ICD-10-CM

## 2019-04-13 LAB — NOVEL CORONAVIRUS, NAA: SARS-CoV-2, NAA: NOT DETECTED

## 2019-04-24 ENCOUNTER — Other Ambulatory Visit: Payer: Self-pay

## 2019-04-27 ENCOUNTER — Other Ambulatory Visit: Payer: Self-pay

## 2019-04-27 DIAGNOSIS — Z20822 Contact with and (suspected) exposure to covid-19: Secondary | ICD-10-CM

## 2019-04-29 LAB — NOVEL CORONAVIRUS, NAA: SARS-CoV-2, NAA: NOT DETECTED

## 2019-12-21 LAB — HM DEXA SCAN

## 2020-03-06 ENCOUNTER — Other Ambulatory Visit: Payer: Self-pay | Admitting: Gastroenterology

## 2020-03-06 ENCOUNTER — Other Ambulatory Visit (HOSPITAL_COMMUNITY): Payer: Self-pay | Admitting: Gastroenterology

## 2020-03-06 DIAGNOSIS — R1011 Right upper quadrant pain: Secondary | ICD-10-CM

## 2020-03-12 ENCOUNTER — Ambulatory Visit
Admission: RE | Admit: 2020-03-12 | Discharge: 2020-03-12 | Disposition: A | Discharge status: Home / Self Care | Payer: Medicare PPO | Source: Ambulatory Visit | Attending: Gastroenterology | Admitting: Gastroenterology

## 2020-03-12 ENCOUNTER — Other Ambulatory Visit: Payer: Self-pay

## 2020-03-12 DIAGNOSIS — R1011 Right upper quadrant pain: Secondary | ICD-10-CM

## 2020-11-10 IMAGING — US US ABDOMEN LIMITED
1 series · 14 of 25 positions shown · non-contrast
Comparison: CT abdomen and pelvis January 07, 2018

CLINICAL DATA: Upper abdominal pain

EXAM:
ULTRASOUND ABDOMEN LIMITED RIGHT UPPER QUADRANT

[Series 1: us abdomen limited · 0.15mm/px · 14 of 40 slices shown]
[im 1/40]
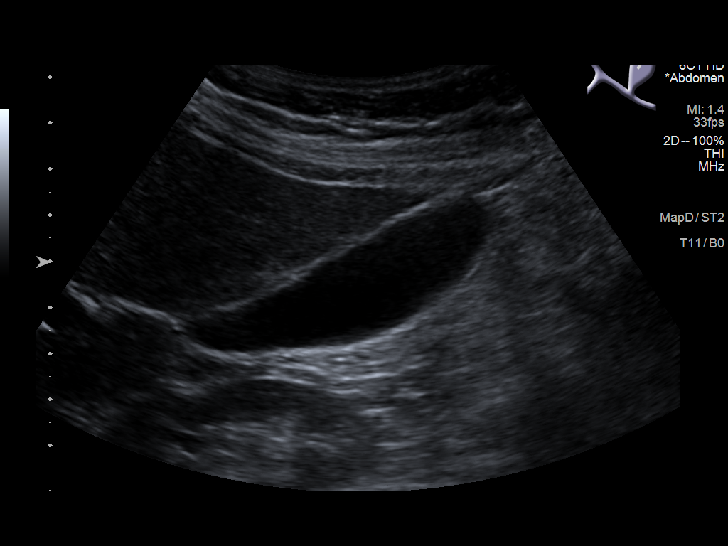
[im 4/40]
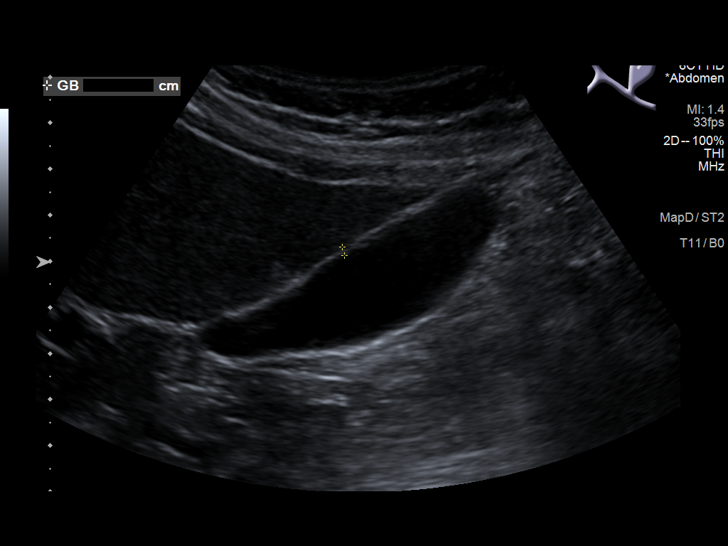
[im 7/40]
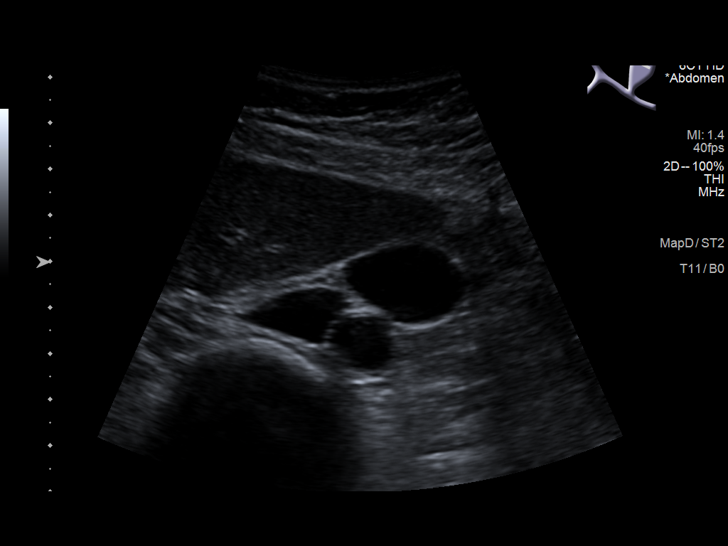
[im 10/40]
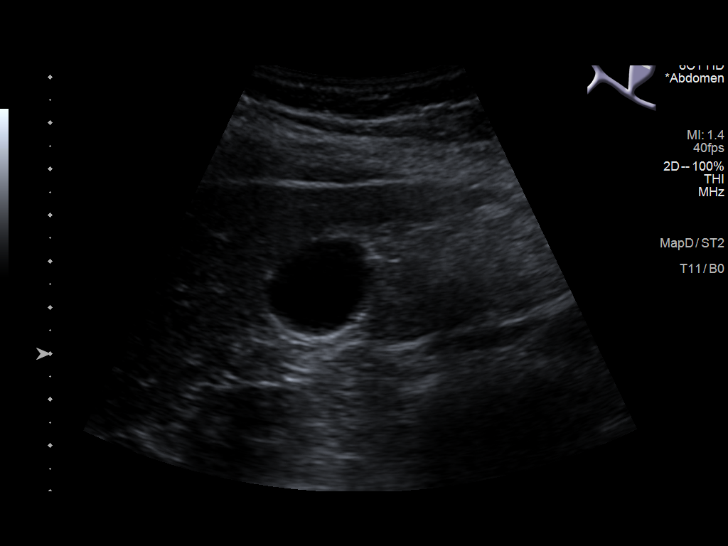
[im 14/40]
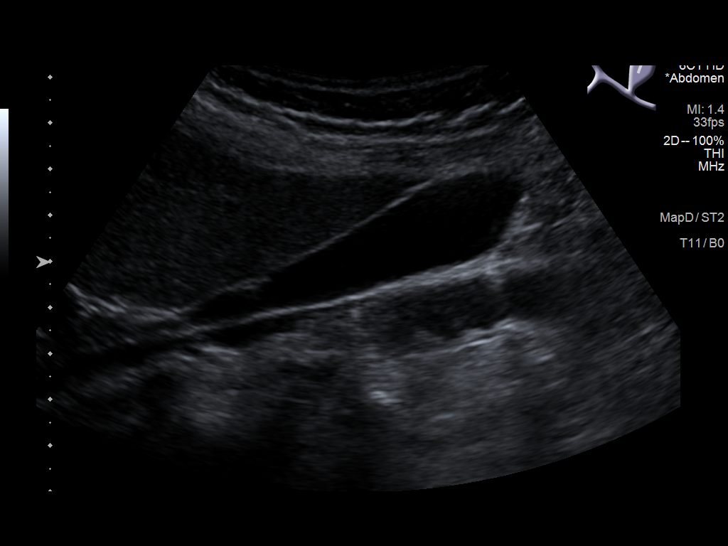
[im 15/40]
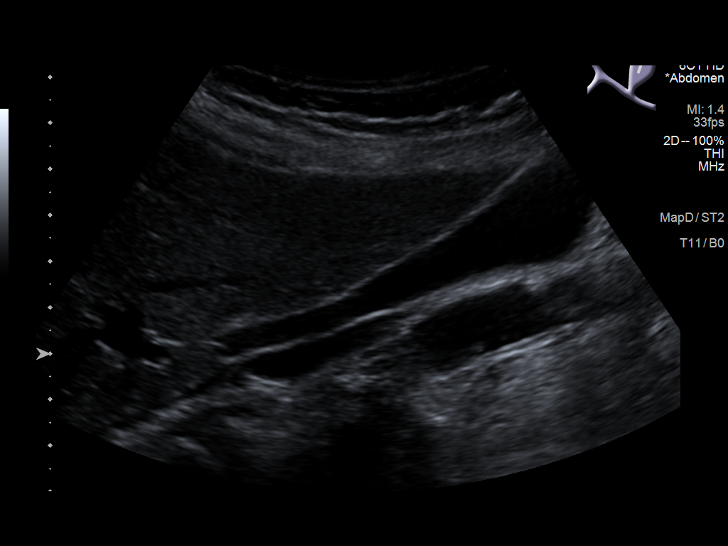
[im 18/40]
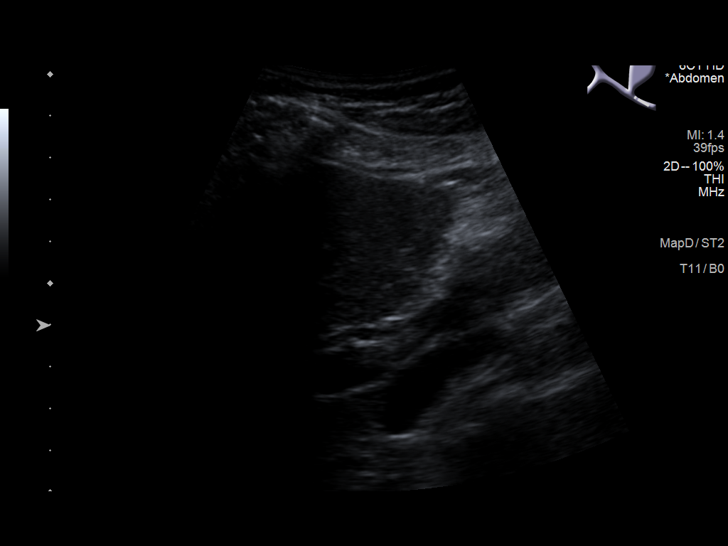
[im 22/40]
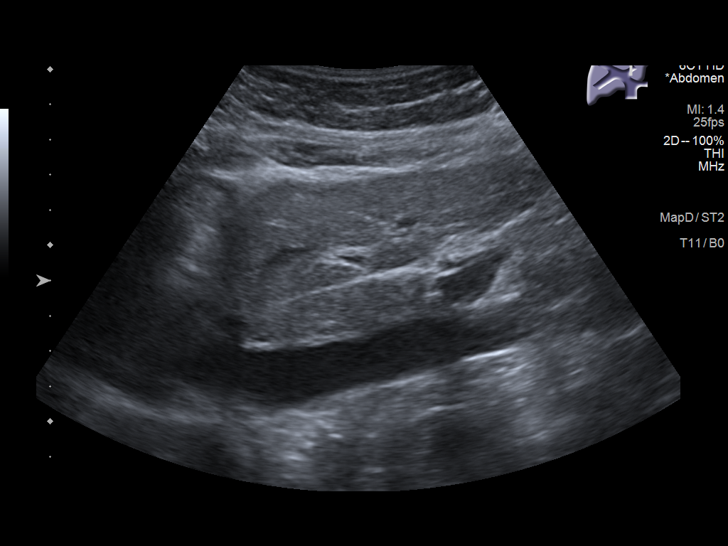
[im 25/40]
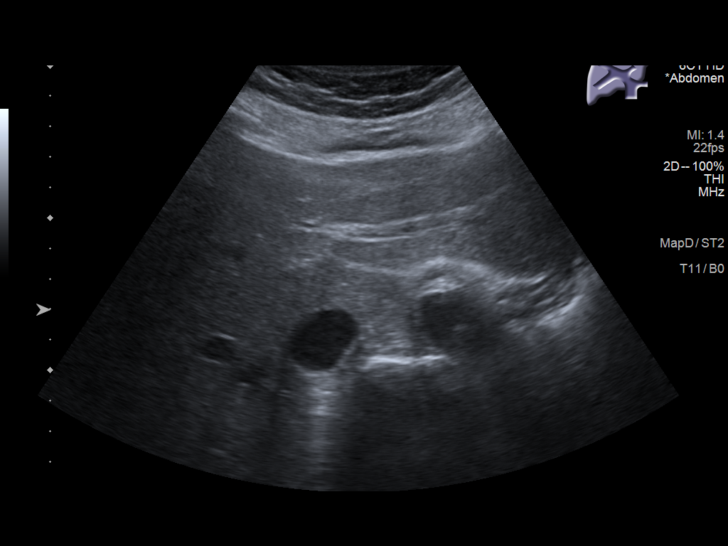
[im 27/40]
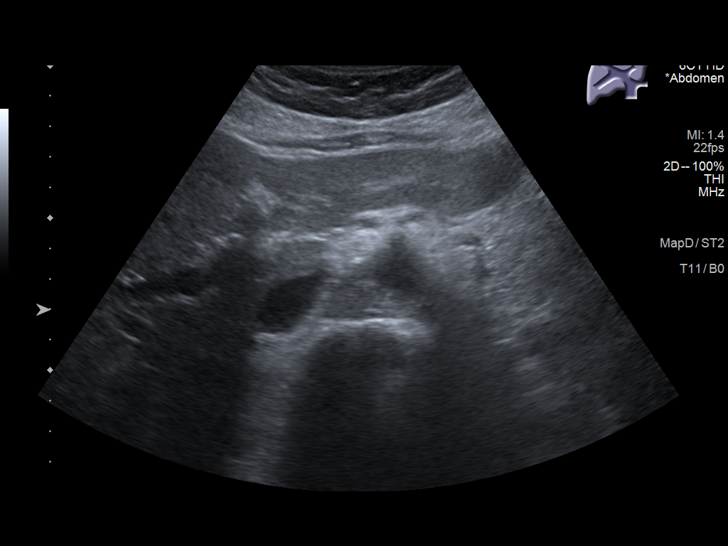
[im 30/40]
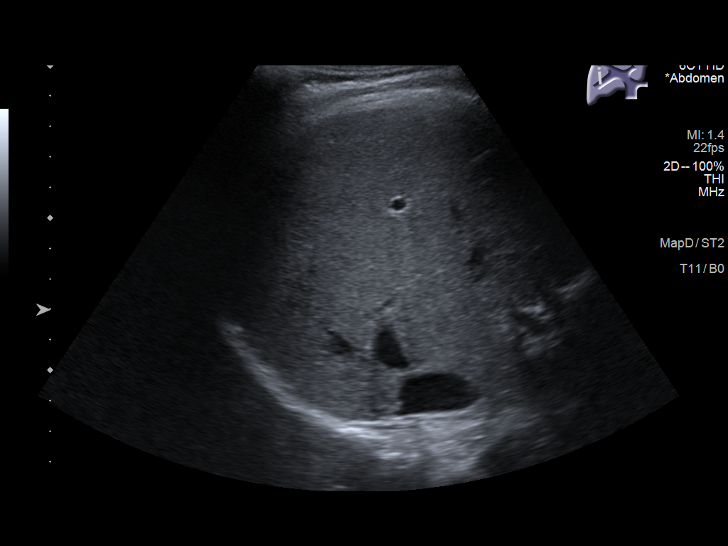
[im 33/40]
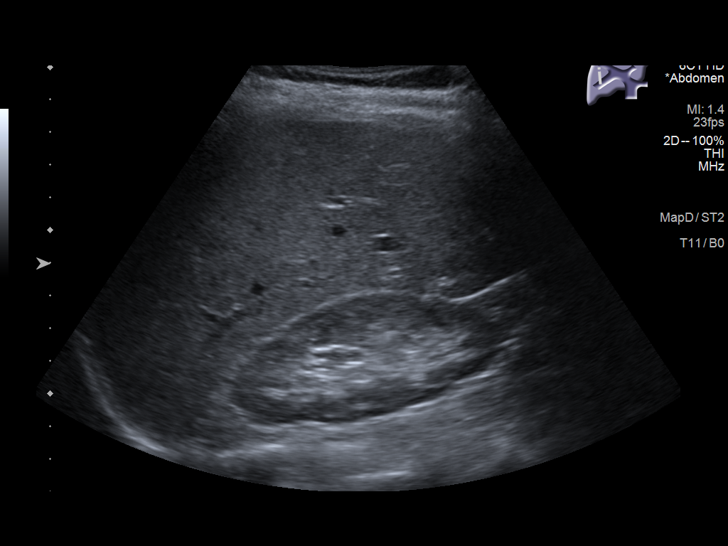
[im 36/40]
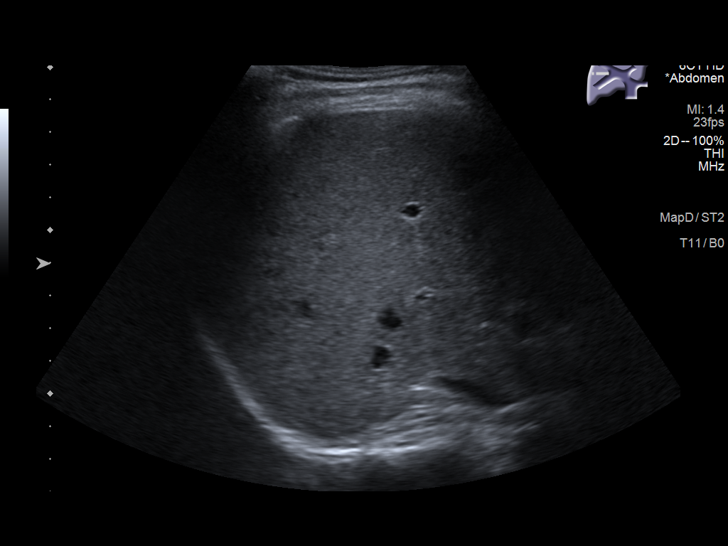
[im 40/40]
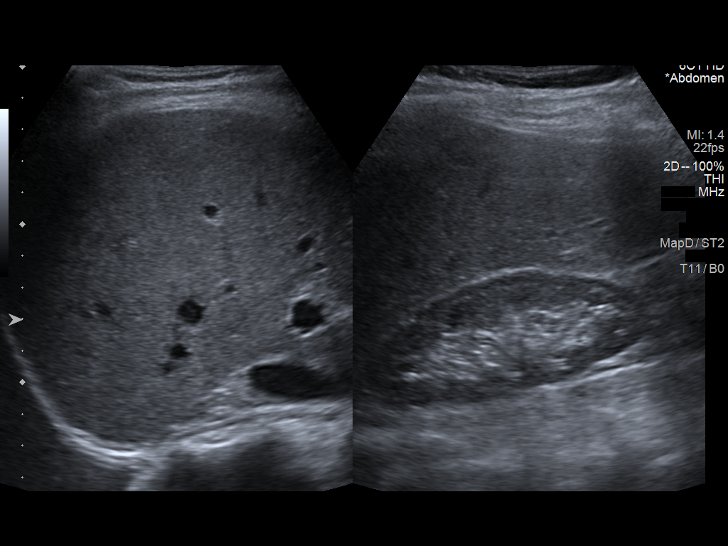

[14 of 25 positions shown; findings below may reference images not displayed]

FINDINGS: Gallbladder:

No gallstones or wall thickening visualized. There is no
pericholecystic fluid. No sonographic Murphy sign noted by
sonographer.

Common bile duct:

Diameter: 2 mm. No intrahepatic or extrahepatic biliary duct
dilatation.

Liver:

No focal lesion identified. Within normal limits in parenchymal
echogenicity. Portal vein is patent on color Doppler imaging with
normal direction of blood flow towards the liver.

Other: None.
IMPRESSION: Study within normal limits.

## 2022-08-18 ENCOUNTER — Other Ambulatory Visit: Payer: Self-pay | Admitting: Gastroenterology

## 2022-08-18 DIAGNOSIS — R1013 Epigastric pain: Secondary | ICD-10-CM

## 2022-08-18 DIAGNOSIS — R112 Nausea with vomiting, unspecified: Secondary | ICD-10-CM

## 2022-08-19 ENCOUNTER — Ambulatory Visit
Admission: RE | Admit: 2022-08-19 | Discharge: 2022-08-19 | Disposition: A | Discharge status: Home / Self Care | Payer: Medicare PPO | Source: Ambulatory Visit | Attending: Gastroenterology | Admitting: Gastroenterology

## 2022-08-19 DIAGNOSIS — R1013 Epigastric pain: Secondary | ICD-10-CM | POA: Insufficient documentation

## 2022-08-19 DIAGNOSIS — R112 Nausea with vomiting, unspecified: Secondary | ICD-10-CM | POA: Diagnosis present

## 2022-08-20 ENCOUNTER — Other Ambulatory Visit: Payer: Self-pay | Admitting: Gastroenterology

## 2022-08-20 DIAGNOSIS — R1011 Right upper quadrant pain: Secondary | ICD-10-CM

## 2022-08-20 DIAGNOSIS — R1013 Epigastric pain: Secondary | ICD-10-CM

## 2022-09-01 ENCOUNTER — Encounter
Admission: RE | Admit: 2022-09-01 | Discharge: 2022-09-01 | Disposition: A | Discharge status: Home / Self Care | Payer: Medicare PPO | Source: Ambulatory Visit | Attending: Gastroenterology | Admitting: Gastroenterology

## 2022-09-01 DIAGNOSIS — R1011 Right upper quadrant pain: Secondary | ICD-10-CM | POA: Diagnosis present

## 2022-09-01 DIAGNOSIS — R1013 Epigastric pain: Secondary | ICD-10-CM | POA: Diagnosis not present

## 2022-09-01 MED ORDER — TECHNETIUM TC 99M MEBROFENIN IV KIT
5.0800 | PACK | Freq: Once | INTRAVENOUS | Status: AC | PRN
  Administered 2022-09-01: 5.08 via INTRAVENOUS

## 2022-12-14 ENCOUNTER — Encounter: Payer: Self-pay | Admitting: Ophthalmology

## 2022-12-14 NOTE — Anesthesia Preprocedure Evaluation (Addendum)
Anesthesia Evaluation  Patient identified by MRN, date of birth, ID band Patient awake    Reviewed: Allergy & Precautions, H&P , NPO status , Patient's Chart, lab work & pertinent test results  Airway Mallampati: I  TM Distance: >3 FB Neck ROM: Full    Dental no notable dental hx.    Pulmonary neg pulmonary ROS   Pulmonary exam normal breath sounds clear to auscultation       Cardiovascular negative cardio ROS Normal cardiovascular exam Rhythm:Regular Rate:Normal  Echo 2018 normal EF, mild MR, mild TR  Holter rare PACs, frequent PVCs, sinus arrhythmia   Neuro/Psych negative neurological ROS  negative psych ROS   GI/Hepatic Neg liver ROS,GERD  ,,  Endo/Other  negative endocrine ROS    Renal/GU negative Renal ROS  negative genitourinary   Musculoskeletal negative musculoskeletal ROS (+)    Abdominal   Peds negative pediatric ROS (+)  Hematology negative hematology ROS (+)   Anesthesia Other Findings GERD (gastroesophageal reflux disease) Mild mitral regurgitation by prior echocardiogram Mild tricuspid regurgitation by prior echocardiogram     Reproductive/Obstetrics negative OB ROS                              Anesthesia Physical Anesthesia Plan  ASA: 3  Anesthesia Plan: MAC   Post-op Pain Management:    Induction: Intravenous  PONV Risk Score and Plan:   Airway Management Planned: Natural Airway and Nasal Cannula  Additional Equipment:   Intra-op Plan:   Post-operative Plan:   Informed Consent: I have reviewed the patients History and Physical, chart, labs and discussed the procedure including the risks, benefits and alternatives for the proposed anesthesia with the patient or authorized representative who has indicated his/her understanding and acceptance.     Dental Advisory Given  Plan Discussed with: Anesthesiologist, CRNA and Surgeon  Anesthesia Plan  Comments: (Patient consented for risks of anesthesia including but not limited to:  - adverse reactions to medications - damage to eyes, teeth, lips or other oral mucosa - nerve damage due to positioning  - sore throat or hoarseness - Damage to heart, brain, nerves, lungs, other parts of body or loss of life  Patient voiced understanding.)         Anesthesia Quick Evaluation

## 2022-12-17 NOTE — Discharge Instructions (Signed)

## 2022-12-22 ENCOUNTER — Ambulatory Visit: Payer: Medicare PPO | Admitting: Anesthesiology

## 2022-12-22 ENCOUNTER — Other Ambulatory Visit: Payer: Self-pay

## 2022-12-22 ENCOUNTER — Encounter
Admission: RE | Disposition: A | Discharge status: Home / Self Care | Payer: Self-pay | Source: Home / Self Care | Attending: Ophthalmology

## 2022-12-22 ENCOUNTER — Encounter: Payer: Self-pay | Admitting: Ophthalmology

## 2022-12-22 ENCOUNTER — Ambulatory Visit
Admission: RE | Admit: 2022-12-22 | Discharge: 2022-12-22 | Disposition: A | Discharge status: Home / Self Care | Payer: Medicare PPO | Attending: Ophthalmology | Admitting: Ophthalmology

## 2022-12-22 DIAGNOSIS — K219 Gastro-esophageal reflux disease without esophagitis: Secondary | ICD-10-CM | POA: Insufficient documentation

## 2022-12-22 DIAGNOSIS — H2512 Age-related nuclear cataract, left eye: Secondary | ICD-10-CM | POA: Diagnosis not present

## 2022-12-22 HISTORY — DX: Nonrheumatic mitral (valve) insufficiency: I34.0

## 2022-12-22 HISTORY — DX: Rheumatic tricuspid insufficiency: I07.1

## 2022-12-22 HISTORY — PX: CATARACT EXTRACTION W/PHACO: SHX586

## 2022-12-22 HISTORY — DX: Gastro-esophageal reflux disease without esophagitis: K21.9

## 2022-12-22 SURGERY — PHACOEMULSIFICATION, CATARACT, WITH IOL INSERTION
Anesthesia: Monitor Anesthesia Care | Site: Eye | Laterality: Left

## 2022-12-22 MED ORDER — TETRACAINE HCL 0.5 % OP SOLN
1.0000 [drp] | OPHTHALMIC | Status: DC | PRN
  Administered 2022-12-22 (×3): 1 [drp] via OPHTHALMIC

## 2022-12-22 MED ORDER — MOXIFLOXACIN HCL 0.5 % OP SOLN
OPHTHALMIC | Status: DC | PRN
  Administered 2022-12-22: .2 mL via OPHTHALMIC

## 2022-12-22 MED ORDER — LACTATED RINGERS IV SOLN: INTRAVENOUS | Status: DC

## 2022-12-22 MED ORDER — SIGHTPATH DOSE#1 BSS IO SOLN
INTRAOCULAR | Status: DC | PRN
  Administered 2022-12-22: 1 mL via INTRAMUSCULAR

## 2022-12-22 MED ORDER — SIGHTPATH DOSE#1 BSS IO SOLN
INTRAOCULAR | Status: DC | PRN
  Administered 2022-12-22: 45 mL via OPHTHALMIC

## 2022-12-22 MED ORDER — SIGHTPATH DOSE#1 BSS IO SOLN
INTRAOCULAR | Status: DC | PRN
  Administered 2022-12-22: 15 mL

## 2022-12-22 MED ORDER — SIGHTPATH DOSE#1 NA CHONDROIT SULF-NA HYALURON 40-17 MG/ML IO SOLN
INTRAOCULAR | Status: DC | PRN
  Administered 2022-12-22: 1 mL via INTRAOCULAR

## 2022-12-22 MED ORDER — ARMC OPHTHALMIC DILATING DROPS
1.0000 | OPHTHALMIC | Status: DC | PRN
  Administered 2022-12-22 (×3): 1 via OPHTHALMIC

## 2022-12-22 MED ORDER — BRIMONIDINE TARTRATE-TIMOLOL 0.2-0.5 % OP SOLN
OPHTHALMIC | Status: DC | PRN
  Administered 2022-12-22: 1 [drp] via OPHTHALMIC

## 2022-12-22 MED ORDER — MIDAZOLAM HCL 2 MG/2ML IJ SOLN
INTRAMUSCULAR | Status: DC | PRN
  Administered 2022-12-22 (×2): 1 mg via INTRAVENOUS

## 2022-12-22 MED ORDER — FENTANYL CITRATE (PF) 100 MCG/2ML IJ SOLN
INTRAMUSCULAR | Status: DC | PRN
  Administered 2022-12-22: 50 ug via INTRAVENOUS

## 2022-12-22 SURGICAL SUPPLY — 12 items
ANGLE REVERSE CUT SHRT 25GA (CUTTER) ×1
CATARACT SUITE SIGHTPATH (MISCELLANEOUS) ×1 IMPLANT
CYSTOTOME ANGL RVRS SHRT 25G (CUTTER) ×1 IMPLANT
CYSTOTOME ANGL RVRS SHRT 25GA (CUTTER) ×1 IMPLANT
FEE CATARACT SUITE SIGHTPATH (MISCELLANEOUS) ×1 IMPLANT
GLOVE BIOGEL PI IND STRL 8 (GLOVE) ×1 IMPLANT
GLOVE SURG ENC TEXT LTX SZ8 (GLOVE) ×1 IMPLANT
LENS CLAREON TORIC CNW0T3 22.0 ×1 IMPLANT
LENS IOL CLRN TRC 3 22.0 IMPLANT
NDL FILTER BLUNT 18X1 1/2 (NEEDLE) ×1 IMPLANT
NEEDLE FILTER BLUNT 18X1 1/2 (NEEDLE) ×1 IMPLANT
SYR 3ML LL SCALE MARK (SYRINGE) ×1 IMPLANT

## 2022-12-22 NOTE — H&P (Signed)
[facility]   Primary Care Physician:  Jaclyn Shaggy, MD Ophthalmologist: Dr. Druscilla Brownie  Pre-Procedure History & Physical: HPI:  Courtney Galloway is a 72 y.o. female here for cataract surgery.   Past Medical History:  Diagnosis Date   GERD (gastroesophageal reflux disease)    Mild mitral regurgitation by prior echocardiogram    Mild tricuspid regurgitation by prior echocardiogram     History reviewed. No pertinent surgical history.  Prior to Admission medications   Medication Sig Start Date End Date Taking? Authorizing Provider  levothyroxine (SYNTHROID) 75 MCG tablet Take 75 mcg by mouth daily before breakfast.   Yes [provider]  Magnesium 250 MG TABS Take by mouth.   Yes [provider]  Multiple Vitamin (MULTI-VITAMINS) TABS Take by mouth.   Yes [provider]  pantoprazole (PROTONIX) 40 MG tablet Take by mouth. 05/23/14  Yes [provider]  Red Yeast Rice Extract 600 MG CAPS Take by mouth.   Yes [provider]    Allergies as of 11/24/2022 - Review Complete 01/07/2018  Allergen Reaction Noted   Amoxicillin Rash 07/16/2014    History reviewed. No pertinent family history.  Social History   Socioeconomic History   Marital status: Married    Spouse name: Not on file   Number of children: Not on file   Years of education: Not on file   Highest education level: Not on file  Occupational History   Not on file  Tobacco Use   Smoking status: Never   Smokeless tobacco: Not on file  Substance and Sexual Activity   Alcohol use: Yes    Alcohol/week: 0.0 standard drinks of alcohol   Drug use: Not on file   Sexual activity: Not on file  Other Topics Concern   Not on file  Social History Narrative   Not on file   Social Determinants of Health   Financial Resource Strain: Not on file  Food Insecurity: Not on file  Transportation Needs: Not on file  Physical Activity: Not on file  Stress: Not on file   Social Connections: Not on file  Intimate Partner Violence: Not on file    Review of Systems: See HPI, otherwise negative ROS  Physical Exam: BP (!) 162/74   Pulse 89   Temp 97.7 F (36.5 C) (Temporal)   Resp 19   Ht 5\' 3"  (1.6 m)   Wt 63.5 kg   SpO2 94%   BMI 24.82 kg/m  General:   Alert, cooperative in NAD Head:  Normocephalic and atraumatic. Respiratory:  Normal work of breathing. Cardiovascular:  RRR  Impression/Plan: Courtney Galloway is here for cataract surgery.  Risks, benefits, limitations, and alternatives regarding cataract surgery have been reviewed with the patient.  Questions have been answered.  All parties agreeable.   Galen Manila, MD  12/22/2022, 9:00 AM

## 2022-12-22 NOTE — Transfer of Care (Signed)
Immediate Anesthesia Transfer of Care Note  Patient: Courtney Galloway  Procedure(s) Performed: CATARACT EXTRACTION PHACO AND INTRAOCULAR LENS PLACEMENT (IOC) LEFT CLAREON TORIC  6.58  00:38.1 (Left: Eye)  Patient Location: PACU  Anesthesia Type: MAC  Level of Consciousness: awake, alert  and patient cooperative  Airway and Oxygen Therapy: Patient Spontanous Breathing and Patient connected to supplemental oxygen  Post-op Assessment: Post-op Vital signs reviewed, Patient's Cardiovascular Status Stable, Respiratory Function Stable, Patent Airway and No signs of Nausea or vomiting  Post-op Vital Signs: Reviewed and stable  Complications: No notable events documented.

## 2022-12-22 NOTE — Anesthesia Postprocedure Evaluation (Signed)
Anesthesia Post Note  Patient: Courtney Galloway  Procedure(s) Performed: CATARACT EXTRACTION PHACO AND INTRAOCULAR LENS PLACEMENT (IOC) LEFT CLAREON TORIC  6.58  00:38.1 (Left: Eye)  Patient location during evaluation: PACU Anesthesia Type: MAC Level of consciousness: awake and alert Pain management: pain level controlled Vital Signs Assessment: post-procedure vital signs reviewed and stable Respiratory status: spontaneous breathing, nonlabored ventilation, respiratory function stable and patient connected to nasal cannula oxygen Cardiovascular status: stable and blood pressure returned to baseline Postop Assessment: no apparent nausea or vomiting Anesthetic complications: no   No notable events documented.   Last Vitals:  Vitals:   12/22/22 0926 12/22/22 0929  BP: (!) 153/77 132/78  Pulse: 61 (!) 57  Resp: 11 10  Temp: 36.8 C   SpO2: 95% 94%    Last Pain:  Vitals:   12/22/22 0929  TempSrc:   PainSc: 0-No pain                 Marisue Humble

## 2022-12-22 NOTE — Op Note (Signed)
PREOPERATIVE DIAGNOSIS:  Nuclear sclerotic cataract of the left eye.   POSTOPERATIVE DIAGNOSIS:  Nuclear sclerotic cataract of the left eye.   OPERATIVE PROCEDURE: Procedure(s): CATARACT EXTRACTION PHACO AND INTRAOCULAR LENS PLACEMENT (IOC) LEFT CLAREON TORIC  6.58  00:38.1   SURGEON:  Galen Manila, MD.   ANESTHESIA: 1.      Managed anesthesia care. 2.     0.29ml os Shugarcaine was instilled following the paracentesis [email or web address]   COMPLICATIONS:  None.   TECHNIQUE:   Stop and chop    DESCRIPTION OF PROCEDURE:  The patient was examined and consented in the preoperative holding area where the aforementioned topical anesthesia was applied to the left eye.  The patient was brought back to the Operating Room where he was sat upright on the gurney and given a target to fixate upon while the eye was marked at the 3:00 and 9:00 position.  The patient was then reclined on the operating table.  The eye was prepped and draped in the usual sterile ophthalmic fashion and a lid speculum was placed. A paracentesis was created with the side port blade and the anterior chamber was filled with viscoelastic. A near clear corneal incision was performed with the steel keratome. A continuous curvilinear capsulorrhexis was performed with a cystotome followed by the capsulorrhexis forceps. Hydrodissection and hydrodelineation were carried out with BSS on a blunt cannula. The lens was removed in a stop and chop technique and the remaining cortical material was removed with the irrigation-aspiration handpiece. The eye was inflated with viscoelastic and the CNWOT lens was placed in the eye and rotated to within a few degrees of the predetermined orientation.  The remaining viscoelastic was removed from the eye.  The Sinskey hook was used to rotate the toric lens into its final resting place at 162 degrees.  0.1 ml of Vigamox was placed in the anterior chamber. The eye was inflated to a physiologic pressure and found to  be watertight.  The eye was dressed with Vigamox.and Combigan. The patient was given protective glasses to wear throughout the day and a shield with which to sleep tonight. The patient was also given drops with which to begin a drop regimen today and will follow-up with me in one day. Implant Name Type Inv. Item Serial No. Manufacturer Lot No. LRB No. Used Action  LENS CLAREON TORIC [id number] 22.0 - [id number]  LENS CLAREON TORIC [id number] 22.0 [id number] [employer]  Left 1 Implanted   Procedure(s): CATARACT EXTRACTION PHACO AND INTRAOCULAR LENS PLACEMENT (IOC) LEFT CLAREON TORIC  6.58  00:38.1 (Left)  Electronically signed: Galen Manila 6/18/20249:24 AM

## 2022-12-23 ENCOUNTER — Encounter: Payer: Self-pay | Admitting: Ophthalmology

## 2022-12-25 NOTE — Anesthesia Preprocedure Evaluation (Addendum)
Anesthesia Evaluation  Patient identified by MRN, date of birth, ID band Patient awake    Reviewed: Allergy & Precautions, H&P , NPO status , Patient's Chart, lab work & pertinent test results  Airway Mallampati: I       Dental   Pulmonary neg pulmonary ROS          Cardiovascular negative cardio ROS   Echo 2018 normal EF, mild MR, mild TR   Holter rare PACs, frequent PVCs, sinus arrhythmia    Neuro/Psych negative neurological ROS  negative psych ROS   GI/Hepatic negative GI ROS, Neg liver ROS,GERD  ,,  Endo/Other  negative endocrine ROS    Renal/GU negative Renal ROS  negative genitourinary   Musculoskeletal negative musculoskeletal ROS (+)    Abdominal   Peds negative pediatric ROS (+)  Hematology negative hematology ROS (+)   Anesthesia Other Findings GERD (gastroesophageal reflux disease) Mild mitral regurgitation by prior echocardiogram Mild tricuspid regurgitation by prior echocardiogram   Previous cataract 12-22-22   Reproductive/Obstetrics negative OB ROS                             Anesthesia Physical Anesthesia Plan  ASA: 3  Anesthesia Plan: MAC   Post-op Pain Management:    Induction: Intravenous  PONV Risk Score and Plan:   Airway Management Planned: Natural Airway and Nasal Cannula  Additional Equipment:   Intra-op Plan:   Post-operative Plan:   Informed Consent: I have reviewed the patients History and Physical, chart, labs and discussed the procedure including the risks, benefits and alternatives for the proposed anesthesia with the patient or authorized representative who has indicated his/her understanding and acceptance.     Dental Advisory Given  Plan Discussed with: Anesthesiologist, CRNA and Surgeon  Anesthesia Plan Comments: (Patient consented for risks of anesthesia including but not limited to:  - adverse reactions to medications - damage  to eyes, teeth, lips or other oral mucosa - nerve damage due to positioning  - sore throat or hoarseness - Damage to heart, brain, nerves, lungs, other parts of body or loss of life  Patient voiced understanding.)        Anesthesia Quick Evaluation

## 2022-12-31 NOTE — Discharge Instructions (Signed)

## 2023-01-05 ENCOUNTER — Other Ambulatory Visit: Payer: Self-pay

## 2023-01-05 ENCOUNTER — Encounter: Payer: Self-pay | Admitting: Ophthalmology

## 2023-01-05 ENCOUNTER — Encounter
Admission: RE | Disposition: A | Discharge status: Home / Self Care | Payer: Self-pay | Source: Home / Self Care | Attending: Ophthalmology

## 2023-01-05 ENCOUNTER — Ambulatory Visit
Admission: RE | Admit: 2023-01-05 | Discharge: 2023-01-05 | Disposition: A | Discharge status: Home / Self Care | Payer: Medicare PPO | Attending: Ophthalmology | Admitting: Ophthalmology

## 2023-01-05 ENCOUNTER — Ambulatory Visit: Payer: Medicare PPO | Admitting: Anesthesiology

## 2023-01-05 DIAGNOSIS — H2511 Age-related nuclear cataract, right eye: Secondary | ICD-10-CM | POA: Diagnosis not present

## 2023-01-05 DIAGNOSIS — K219 Gastro-esophageal reflux disease without esophagitis: Secondary | ICD-10-CM | POA: Insufficient documentation

## 2023-01-05 HISTORY — PX: CATARACT EXTRACTION W/PHACO: SHX586

## 2023-01-05 SURGERY — PHACOEMULSIFICATION, CATARACT, WITH IOL INSERTION
Anesthesia: Monitor Anesthesia Care | Laterality: Right

## 2023-01-05 MED ORDER — SIGHTPATH DOSE#1 NA CHONDROIT SULF-NA HYALURON 40-17 MG/ML IO SOLN
INTRAOCULAR | Status: DC | PRN
  Administered 2023-01-05: 1 mL via INTRAOCULAR

## 2023-01-05 MED ORDER — FENTANYL CITRATE (PF) 100 MCG/2ML IJ SOLN
INTRAMUSCULAR | Status: DC | PRN
  Administered 2023-01-05: 50 ug via INTRAVENOUS

## 2023-01-05 MED ORDER — ARMC OPHTHALMIC DILATING DROPS
1.0000 | OPHTHALMIC | Status: DC | PRN
  Administered 2023-01-05 (×3): 1 via OPHTHALMIC

## 2023-01-05 MED ORDER — SIGHTPATH DOSE#1 BSS IO SOLN
INTRAOCULAR | Status: DC | PRN
  Administered 2023-01-05: 15 mL via INTRAOCULAR

## 2023-01-05 MED ORDER — TETRACAINE HCL 0.5 % OP SOLN
1.0000 [drp] | OPHTHALMIC | Status: DC | PRN
  Administered 2023-01-05 (×3): 1 [drp] via OPHTHALMIC

## 2023-01-05 MED ORDER — MOXIFLOXACIN HCL 0.5 % OP SOLN
OPHTHALMIC | Status: DC | PRN
  Administered 2023-01-05: .2 mL via OPHTHALMIC

## 2023-01-05 MED ORDER — LACTATED RINGERS IV SOLN: INTRAVENOUS | Status: DC

## 2023-01-05 MED ORDER — SIGHTPATH DOSE#1 BSS IO SOLN
INTRAOCULAR | Status: DC | PRN
  Administered 2023-01-05: 44 mL via OPHTHALMIC

## 2023-01-05 MED ORDER — MIDAZOLAM HCL 2 MG/2ML IJ SOLN
INTRAMUSCULAR | Status: DC | PRN
  Administered 2023-01-05: 2 mg via INTRAVENOUS

## 2023-01-05 MED ORDER — BRIMONIDINE TARTRATE-TIMOLOL 0.2-0.5 % OP SOLN
OPHTHALMIC | Status: DC | PRN
  Administered 2023-01-05: 1 [drp] via OPHTHALMIC

## 2023-01-05 MED ORDER — SIGHTPATH DOSE#1 BSS IO SOLN
INTRAOCULAR | Status: DC | PRN
  Administered 2023-01-05: 2 mL

## 2023-01-05 SURGICAL SUPPLY — 12 items
ANGLE REVERSE CUT SHRT 25GA (CUTTER) ×1
CATARACT SUITE SIGHTPATH (MISCELLANEOUS) ×1 IMPLANT
CYSTOTOME ANGL RVRS SHRT 25G (CUTTER) ×1 IMPLANT
CYSTOTOME ANGL RVRS SHRT 25GA (CUTTER) ×1 IMPLANT
FEE CATARACT SUITE SIGHTPATH (MISCELLANEOUS) ×1 IMPLANT
GLOVE BIOGEL PI IND STRL 8 (GLOVE) ×1 IMPLANT
GLOVE SURG ENC TEXT LTX SZ8 (GLOVE) ×1 IMPLANT
LENS CLAREON TORIC 21.5 ×1 IMPLANT
LENS IOL CLRN TRC 3 21.5 IMPLANT
NDL FILTER BLUNT 18X1 1/2 (NEEDLE) ×1 IMPLANT
NEEDLE FILTER BLUNT 18X1 1/2 (NEEDLE) ×1 IMPLANT
SYR 3ML LL SCALE MARK (SYRINGE) ×1 IMPLANT

## 2023-01-05 NOTE — H&P (Signed)
[facility]   Primary Care Physician:  Jaclyn Shaggy, MD Ophthalmologist: Dr. Maren Reamer  Pre-Procedure History & Physical: HPI:  Courtney Galloway is a 72 y.o. female here for cataract surgery.   Past Medical History:  Diagnosis Date   GERD (gastroesophageal reflux disease)    Mild mitral regurgitation by prior echocardiogram    Mild tricuspid regurgitation by prior echocardiogram     Past Surgical History:  Procedure Laterality Date   CATARACT EXTRACTION W/PHACO Left 12/22/2022   Procedure: CATARACT EXTRACTION PHACO AND INTRAOCULAR LENS PLACEMENT (IOC) LEFT CLAREON TORIC  6.58  00:38.1;  Surgeon: Galen Manila, MD;  Location: [facility];  Service: Ophthalmology;  Laterality: Left;    Prior to Admission medications   Medication Sig Start Date End Date Taking? Authorizing Provider  levothyroxine (SYNTHROID) 75 MCG tablet Take 75 mcg by mouth daily before breakfast.   Yes [provider]  Magnesium 250 MG TABS Take by mouth.   Yes [provider]  Multiple Vitamin (MULTI-VITAMINS) TABS Take by mouth.   Yes [provider]  pantoprazole (PROTONIX) 40 MG tablet Take by mouth. 05/23/14  Yes [provider]  Red Yeast Rice Extract 600 MG CAPS Take by mouth.   Yes [provider]    Allergies as of 11/24/2022 - Review Complete 01/07/2018  Allergen Reaction Noted   Amoxicillin Rash 07/16/2014    History reviewed. No pertinent family history.  Social History   Socioeconomic History   Marital status: Married    Spouse name: Not on file   Number of children: Not on file   Years of education: Not on file   Highest education level: Not on file  Occupational History   Not on file  Tobacco Use   Smoking status: Never   Smokeless tobacco: Not on file  Substance and Sexual Activity   Alcohol use: Yes    Alcohol/week: 0.0 standard drinks of alcohol   Drug use: Not on file   Sexual activity: Not on file  Other Topics  Concern   Not on file  Social History Narrative   Not on file   Social Determinants of Health   Financial Resource Strain: Not on file  Food Insecurity: Not on file  Transportation Needs: Not on file  Physical Activity: Not on file  Stress: Not on file  Social Connections: Not on file  Intimate Partner Violence: Not on file    Review of Systems: See HPI, otherwise negative ROS  Physical Exam: BP 132/72   Temp 98.1 F (36.7 C) (Temporal)   Resp 12   Ht 5' 2.99" (1.6 m)   Wt 63 kg   SpO2 95%   BMI 24.59 kg/m  General:   Alert, cooperative in NAD Head:  Normocephalic and atraumatic. Respiratory:  Normal work of breathing. Cardiovascular:  RRR  Impression/Plan: Courtney Galloway is here for cataract surgery.  Risks, benefits, limitations, and alternatives regarding cataract surgery have been reviewed with the patient.  Questions have been answered.  All parties agreeable.   Galen Manila, MD  01/05/2023, 11:35 AM

## 2023-01-05 NOTE — Transfer of Care (Signed)
Immediate Anesthesia Transfer of Care Note  Patient: Courtney Galloway  Procedure(s) Performed: CATARACT EXTRACTION PHACO AND INTRAOCULAR LENS PLACEMENT (IOC) RIGHT CLAREON TORIC 6.58 00:39.8 (Right)  Patient Location: PACU  Anesthesia Type: MAC  Level of Consciousness: awake, alert  and patient cooperative  Airway and Oxygen Therapy: Patient Spontanous Breathing and Patient connected to supplemental oxygen  Post-op Assessment: Post-op Vital signs reviewed, Patient's Cardiovascular Status Stable, Respiratory Function Stable, Patent Airway and No signs of Nausea or vomiting  Post-op Vital Signs: Reviewed and stable  Complications: No notable events documented.

## 2023-01-05 NOTE — Anesthesia Postprocedure Evaluation (Signed)
Anesthesia Post Note  Patient: Courtney Galloway  Procedure(s) Performed: CATARACT EXTRACTION PHACO AND INTRAOCULAR LENS PLACEMENT (IOC) RIGHT CLAREON TORIC 6.58 00:39.8 (Right)  Patient location during evaluation: PACU Anesthesia Type: MAC Level of consciousness: awake and alert Pain management: pain level controlled Vital Signs Assessment: post-procedure vital signs reviewed and stable Respiratory status: spontaneous breathing, nonlabored ventilation, respiratory function stable and patient connected to nasal cannula oxygen Cardiovascular status: stable and blood pressure returned to baseline Postop Assessment: no apparent nausea or vomiting Anesthetic complications: no   No notable events documented.   Last Vitals:  Vitals:   01/05/23 1200 01/05/23 1205  BP: 134/76 127/67  Pulse: 61   Resp: 16   Temp: 36.7 C (!) 36.4 C  SpO2: 97%     Last Pain:  Vitals:   01/05/23 1205  TempSrc:   PainSc: 0-No pain                 Leshawn Houseworth C Jamorian Dimaria

## 2023-01-05 NOTE — Op Note (Signed)
PREOPERATIVE DIAGNOSIS:  Nuclear sclerotic cataract of the right eye.   POSTOPERATIVE DIAGNOSIS:  Nuclear sclerotic cataract of the right eye.   OPERATIVE PROCEDURE: Procedure(s): CATARACT EXTRACTION PHACO AND INTRAOCULAR LENS PLACEMENT (IOC) RIGHT CLAREON TORIC 6.58 00:39.8   SURGEON:  Galen Manila, MD.   ANESTHESIA: 1.      Managed anesthesia care. 2.     0.44ml of Shugarcaine was instilled following the paracentesis  Anesthesiologist: Marisue Humble, MD CRNA: Barbette Hair, CRNA  COMPLICATIONS:  None.   TECHNIQUE:   Stop and chop    DESCRIPTION OF PROCEDURE:  The patient was examined and consented in the preoperative holding area where the aforementioned topical anesthesia was applied to the right eye.  The patient was brought back to the Operating Room where he was sat upright on the gurney and given a target to fixate upon while the eye was marked at the 3:00 and 9:00 position.  The patient was then reclined on the operating table.  The eye was prepped and draped in the usual sterile ophthalmic fashion and a lid speculum was placed. A paracentesis was created with the side port blade and the anterior chamber was filled with viscoelastic. A near clear corneal incision was performed with the steel keratome. A continuous curvilinear capsulorrhexis was performed with a cystotome followed by the capsulorrhexis forceps. Hydrodissection and hydrodelineation were carried out with BSS on a blunt cannula. The lens was removed in a stop and chop technique and the remaining cortical material was removed with the irrigation-aspiration handpiece. The eye was inflated with viscoelastic and the CNWOTT  lens  was placed in the eye and rotated to within a few degrees of the predetermined orientation.  The remaining viscoelastic was removed from the eye.  The Sinskey hook was used to rotate the toric lens into its final resting place at183 degrees.  0. The eye was inflated to a physiologic pressure and found  to be watertight. 0.78ml of Vigamox was placed in the anterior chamber.  The eye was dressed with Vigamox. The patient was given protective glasses to wear throughout the day and a shield with which to sleep tonight. The patient was also given drops with which to begin a drop regimen today and will follow-up with me in one day. Implant Name Type Inv. Item Serial No. Manufacturer Lot No. LRB No. Used Action  LENS CLAREON TORIC 21.5 - [id number]  LENS CLAREON TORIC 21.5 [id number] [employer]  Right 1 Implanted   Procedure(s): CATARACT EXTRACTION PHACO AND INTRAOCULAR LENS PLACEMENT (IOC) RIGHT CLAREON TORIC 6.58 00:39.8 (Right)  Electronically signed: Galen Manila 01/05/2023 12:00 PM

## 2023-01-06 ENCOUNTER — Encounter: Payer: Self-pay | Admitting: Ophthalmology

## 2023-01-21 LAB — HM MAMMOGRAPHY

## 2023-11-16 ENCOUNTER — Ambulatory Visit: Admitting: Sleep Medicine

## 2024-01-12 ENCOUNTER — Ambulatory Visit

## 2024-01-12 ENCOUNTER — Encounter: Payer: Self-pay | Admitting: Cardiology

## 2024-01-12 ENCOUNTER — Ambulatory Visit: Attending: Cardiology | Admitting: Cardiology

## 2024-01-12 VITALS — BP 140/70 | HR 71 | Ht 63.0 in | Wt 140.2 lb

## 2024-01-12 DIAGNOSIS — K219 Gastro-esophageal reflux disease without esophagitis: Secondary | ICD-10-CM | POA: Diagnosis not present

## 2024-01-12 DIAGNOSIS — I493 Ventricular premature depolarization: Secondary | ICD-10-CM

## 2024-01-12 NOTE — Progress Notes (Signed)
 Cardiology Office Note:    Date:  01/12/2024   ID:  Courtney Galloway, DOB [birth year], MRN [id number]  PCP:  Corlis Honor BROCKS, MD   [facility] Providers Cardiologist:  None     Referring MD: Mychart, Generic Provid*   Chief Complaint  Patient presents with   New Patient (Initial Visit)    Establish care for PVC's. Former patient of Dr. Bosie at [facility]. Patient denies chest pain or shortness of breath.     History of Present Illness:    Courtney Galloway is a 73 y.o. female with a hx of PVCs, GERD who presents to establish care.  Previously seen by [facility] cardiology from a cardiac perspective, previous cardiologist retired.  Has a history of palpitations, cardiac monitor in 2022 showed frequent PVCs with no sustained arrhythmias.  Echo 2018 EF 50-55%, mild MR  She still has occasional palpitations especially when she sleeps.  Denies chest pain or shortness of breath.   Past Medical History:  Diagnosis Date   GERD (gastroesophageal reflux disease)    Mild mitral regurgitation by prior echocardiogram    Mild tricuspid regurgitation by prior echocardiogram    PVC's (premature ventricular contractions)     Past Surgical History:  Procedure Laterality Date   CATARACT EXTRACTION W/PHACO Left 12/22/2022   Procedure: CATARACT EXTRACTION PHACO AND INTRAOCULAR LENS PLACEMENT (IOC) LEFT CLAREON TORIC  6.58  00:38.1;  Surgeon: Jaye Fallow, MD;  Location: [facility];  Service: Ophthalmology;  Laterality: Left;   CATARACT EXTRACTION W/PHACO Right 01/05/2023   Procedure: CATARACT EXTRACTION PHACO AND INTRAOCULAR LENS PLACEMENT (IOC) RIGHT CLAREON TORIC 6.58 00:39.8;  Surgeon: Jaye Fallow, MD;  Location: [facility];  Service: Ophthalmology;  Laterality: Right;    Current Medications: Current Meds  Medication Sig   cetirizine (ALLERGY, CETIRIZINE,) 10 MG tablet Take 10 mg by mouth daily.   levothyroxine (SYNTHROID) 75 MCG tablet Take 75 mcg by  mouth daily before breakfast.   LUMIGAN 0.01 % SOLN Place 1 drop into the left eye at bedtime.   Magnesium 250 MG TABS Take by mouth.   Multiple Vitamin (MULTI-VITAMINS) TABS Take by mouth.   pantoprazole (PROTONIX) 40 MG tablet Take by mouth.   Red Yeast Rice Extract 600 MG CAPS Take by mouth.     Allergies:   Amoxicillin   Social History   Socioeconomic History   Marital status: Married    Spouse name: Not on file   Number of children: Not on file   Years of education: Not on file   Highest education level: Not on file  Occupational History   Not on file  Tobacco Use   Smoking status: Never   Smokeless tobacco: Not on file  Vaping Use   Vaping status: Never Used  Substance and Sexual Activity   Alcohol use: Not Currently   Drug use: Never   Sexual activity: Not on file  Other Topics Concern   Not on file  Social History Narrative   Not on file   Social Drivers of Health   Financial Resource Strain: Not on file  Food Insecurity: Not on file  Transportation Needs: Not on file  Physical Activity: Not on file  Stress: Not on file  Social Connections: Not on file     Family History: The patient's family history includes Arrhythmia in her father; Atrial fibrillation in her father; Cancer - Lung in her sister; Diabetes in her sister; Hodgkin's lymphoma in her sister; Hyperlipidemia in her mother;  Stroke in her father.  ROS:   Please see the history of present illness.     All other systems reviewed and are negative.  EKGs/Labs/Other Studies Reviewed:    The following studies were reviewed today:  EKG Interpretation Date/Time:  Wednesday January 12 2024 11:10:09 EDT Ventricular Rate:  71 PR Interval:  172 QRS Duration:  136 QT Interval:  402 QTC Calculation: 436 R Axis:   -47  Text Interpretation: Normal sinus rhythm Left axis deviation Left bundle branch block Confirmed by Darliss Rogue ([id number]) on 01/12/2024 11:21:36 AM    Recent Labs: No results found  for requested labs within last 365 days.  Recent Lipid Panel    Component Value Date/Time   CHOL 195 08/09/2012 0529   TRIG 41 08/09/2012 0529   HDL 78 (H) 08/09/2012 0529   VLDL 8 08/09/2012 0529   LDLCALC 109 (H) 08/09/2012 0529     Risk Assessment/Calculations:          Physical Exam:    VS:  BP (!) 140/70 (BP Location: Right Arm, Patient Position: Sitting, Cuff Size: Normal)   Pulse 71   Ht 5' 3 (1.6 m)   Wt 140 lb 4 oz (63.6 kg)   SpO2 98%   BMI 24.84 kg/m     Wt Readings from Last 3 Encounters:  01/12/24 140 lb 4 oz (63.6 kg)  01/05/23 138 lb 12.8 oz (63 kg)  12/22/22 140 lb 1.6 oz (63.5 kg)     GEN:  Well nourished, well developed in no acute distress HEENT: Normal NECK: No JVD; No carotid bruits CARDIAC: RRR, no murmurs, rubs, gallops RESPIRATORY:  Clear to auscultation without rales, wheezing or rhonchi  ABDOMEN: Soft, non-tender, non-distended MUSCULOSKELETAL:  No edema; No deformity  SKIN: Warm and dry NEUROLOGIC:  Alert and oriented x 3 PSYCHIATRIC:  Normal affect   ASSESSMENT:    1. PVC's (premature ventricular contractions)   2. Gastroesophageal reflux disease, unspecified whether esophagitis present    PLAN:    In order of problems listed above:  History of PVCs, still with palpitations.  Place cardiac monitor to evaluate PVC burden or any other significant arrhythmias.  Obtain echocardiogram. Reflux, continue Protonix 40 mg daily.  Follow-up after echo and cardiac monitor.      Medication Adjustments/Labs and Tests Ordered: Current medicines are reviewed at length with the patient today.  Concerns regarding medicines are outlined above.  Orders Placed This Encounter  Procedures   LONG TERM MONITOR (3-14 DAYS)   EKG 12-Lead   ECHOCARDIOGRAM COMPLETE   No orders of the defined types were placed in this encounter.   Patient Instructions  Medication Instructions:  Your physician recommends that you continue on your current  medications as directed. Please refer to the Current Medication list given to you today.   *If you need a refill on your cardiac medications before your next appointment, please call your pharmacy*  Lab Work: No labs ordered today  If you have labs (blood work) drawn today and your tests are completely normal, you will receive your results only by: MyChart Message (if you have MyChart) OR A paper copy in the mail If you have any lab test that is abnormal or we need to change your treatment, we will call you to review the results.  Testing/Procedures: Your physician has requested that you have an echocardiogram. Echocardiography is a painless test that uses sound waves to create images of your heart. It provides your doctor with information about the  size and shape of your heart and how well your heart's chambers and valves are working.   You may receive an ultrasound enhancing agent through an IV if needed to better visualize your heart during the echo. This procedure takes approximately one hour.  There are no restrictions for this procedure.  This will take place at [address] [facility] [address], [place] [address]  Please note: We ask at that you not bring children with you during ultrasound (echo/ vascular) testing. Due to room size and safety concerns, children are not allowed in the ultrasound rooms during exams. Our front office staff cannot provide observation of children in our lobby area while testing is being conducted. An adult accompanying a patient to their appointment will only be allowed in the ultrasound room at the discretion of the ultrasound technician under special circumstances. We apologize for any inconvenience.   Your physician has recommended that you wear a Zio monitor.   This monitor is a medical device that records the heart's electrical activity. Doctors most often use these monitors to diagnose arrhythmias. Arrhythmias are problems with the speed  or rhythm of the heartbeat. The monitor is a small device applied to your chest. You can wear one while you do your normal daily activities. While wearing this monitor if you have any symptoms to push the button and record what you felt. Once you have worn this monitor for the period of time provider prescribed (Usually 14 days), you will return the monitor device in the postage paid box. Once it is returned they will download the data collected and provide us  with a report which the provider will then review and we will call you with those results. Important tips:  Avoid showering during the first 24 hours of wearing the monitor. Avoid excessive sweating to help maximize wear time. Do not submerge the device, no hot tubs, and no swimming pools. Keep any lotions or oils away from the patch. After 24 hours you may shower with the patch on. Take brief showers with your back facing the shower head.  Do not remove patch once it has been placed because that will interrupt data and decrease adhesive wear time. Push the button when you have any symptoms and write down what you were feeling. Once you have completed wearing your monitor, remove and place into box which has postage paid and place in your outgoing mailbox.  If for some reason you have misplaced your box then call our office and we can provide another box and/or mail it off for you.   Follow-Up: At [facility], you and your health needs are our priority.  As part of our continuing mission to provide you with exceptional heart care, our providers are all part of one team.  This team includes your primary Cardiologist (physician) and Advanced Practice Providers or APPs (Physician Assistants and Nurse Practitioners) who all work together to provide you with the care you need, when you need it.  Your next appointment:   3 month(s)  Provider:   You may see Dr. Darliss or one of the following Advanced Practice Providers on your  designated Care Team:   Lonni Meager, NP Lesley Maffucci, PA-C Bernardino Bring, PA-C Cadence Grandin, PA-C Tylene Lunch, NP Barnie Hila, NP    We recommend signing up for the patient portal called MyChart.  Sign up information is provided on this After Visit Summary.  MyChart is used to connect with patients for Virtual Visits (Telemedicine).  Patients are able to view lab/test results, encounter notes, upcoming appointments, etc.  Non-urgent messages can be sent to your provider as well.   To learn more about what you can do with MyChart, go to [email or web address].          Signed, Redell Cave, MD  01/12/2024 1:01 PM    [facility]

## 2024-01-12 NOTE — Patient Instructions (Signed)
 Medication Instructions:  Your physician recommends that you continue on your current medications as directed. Please refer to the Current Medication list given to you today.   *If you need a refill on your cardiac medications before your next appointment, please call your pharmacy*  Lab Work: No labs ordered today  If you have labs (blood work) drawn today and your tests are completely normal, you will receive your results only by: MyChart Message (if you have MyChart) OR A paper copy in the mail If you have any lab test that is abnormal or we need to change your treatment, we will call you to review the results.  Testing/Procedures: Your physician has requested that you have an echocardiogram. Echocardiography is a painless test that uses sound waves to create images of your heart. It provides your doctor with information about the size and shape of your heart and how well your heart's chambers and valves are working.   You may receive an ultrasound enhancing agent through an IV if needed to better visualize your heart during the echo. This procedure takes approximately one hour.  There are no restrictions for this procedure.  This will take place at [address] [facility] [address]) [address], [place] [address]  Please note: We ask at that you not bring children with you during ultrasound (echo/ vascular) testing. Due to room size and safety concerns, children are not allowed in the ultrasound rooms during exams. Our front office staff cannot provide observation of children in our lobby area while testing is being conducted. An adult accompanying a patient to their appointment will only be allowed in the ultrasound room at the discretion of the ultrasound technician under special circumstances. We apologize for any inconvenience.   Your physician has recommended that you wear a Zio monitor.   This monitor is a medical device that records the heart's electrical activity. Doctors most  often use these monitors to diagnose arrhythmias. Arrhythmias are problems with the speed or rhythm of the heartbeat. The monitor is a small device applied to your chest. You can wear one while you do your normal daily activities. While wearing this monitor if you have any symptoms to push the button and record what you felt. Once you have worn this monitor for the period of time provider prescribed (Usually 14 days), you will return the monitor device in the postage paid box. Once it is returned they will download the data collected and provide us  with a report which the provider will then review and we will call you with those results. Important tips:  Avoid showering during the first 24 hours of wearing the monitor. Avoid excessive sweating to help maximize wear time. Do not submerge the device, no hot tubs, and no swimming pools. Keep any lotions or oils away from the patch. After 24 hours you may shower with the patch on. Take brief showers with your back facing the shower head.  Do not remove patch once it has been placed because that will interrupt data and decrease adhesive wear time. Push the button when you have any symptoms and write down what you were feeling. Once you have completed wearing your monitor, remove and place into box which has postage paid and place in your outgoing mailbox.  If for some reason you have misplaced your box then call our office and we can provide another box and/or mail it off for you.   Follow-Up: At [facility], you and your health needs are our  priority.  As part of our continuing mission to provide you with exceptional heart care, our providers are all part of one team.  This team includes your primary Cardiologist (physician) and Advanced Practice Providers or APPs (Physician Assistants and Nurse Practitioners) who all work together to provide you with the care you need, when you need it.  Your next appointment:   3 month(s)  Provider:   You  may see Dr Junnie Olives or one of the following Advanced Practice Providers on your designated Care Team:   Laneta Pintos, NP Gildardo Labrador, PA-C Varney Gentleman, PA-C Cadence Niagara, PA-C Ronald Cockayne, NP Morey Ar, NP    We recommend signing up for the patient portal called "MyChart".  Sign up information is provided on this After Visit Summary.  MyChart is used to connect with patients for Virtual Visits (Telemedicine).  Patients are able to view lab/test results, encounter notes, upcoming appointments, etc.  Non-urgent messages can be sent to your provider as well.   To learn more about what you can do with MyChart, go to [email or web address].

## 2024-01-31 ENCOUNTER — Ambulatory Visit: Payer: Self-pay | Admitting: Cardiology

## 2024-01-31 DIAGNOSIS — I493 Ventricular premature depolarization: Secondary | ICD-10-CM | POA: Diagnosis not present

## 2024-02-01 NOTE — Telephone Encounter (Signed)
 Called patient to inform of test results. Patient verbalized understanding of all information given and did not have any questions at this time

## 2024-02-16 ENCOUNTER — Ambulatory Visit: Attending: Cardiology

## 2024-02-16 DIAGNOSIS — I493 Ventricular premature depolarization: Secondary | ICD-10-CM | POA: Diagnosis not present

## 2024-02-16 LAB — ECHOCARDIOGRAM COMPLETE
AR max vel: 2.15 cm2
AV Area VTI: 2.17 cm2
AV Area mean vel: 2.21 cm2
AV Mean grad: 4 mmHg
AV Peak grad: 6.6 mmHg
Ao pk vel: 1.28 m/s
Area-P 1/2: 2.62 cm2
MV VTI: 3.36 cm2
S' Lateral: 2.8 cm

## 2024-02-24 ENCOUNTER — Other Ambulatory Visit: Payer: Self-pay

## 2024-02-24 ENCOUNTER — Ambulatory Visit: Admitting: Internal Medicine

## 2024-02-24 ENCOUNTER — Telehealth: Payer: Self-pay

## 2024-02-24 ENCOUNTER — Encounter: Payer: Self-pay | Admitting: Internal Medicine

## 2024-02-24 VITALS — BP 132/78 | HR 87 | Temp 98.4°F | Resp 16 | Ht 63.0 in | Wt 143.2 lb

## 2024-02-24 DIAGNOSIS — E039 Hypothyroidism, unspecified: Secondary | ICD-10-CM | POA: Insufficient documentation

## 2024-02-24 DIAGNOSIS — Z9109 Other allergy status, other than to drugs and biological substances: Secondary | ICD-10-CM | POA: Diagnosis not present

## 2024-02-24 DIAGNOSIS — Z1231 Encounter for screening mammogram for malignant neoplasm of breast: Secondary | ICD-10-CM

## 2024-02-24 DIAGNOSIS — K219 Gastro-esophageal reflux disease without esophagitis: Secondary | ICD-10-CM | POA: Diagnosis not present

## 2024-02-24 NOTE — Telephone Encounter (Signed)
 Copied from CRM [id number]. Topic: General - Other >> Feb 24, 2024 11:55 AM Dedra B wrote: Reason for CRM: Pt called to let provider know that she found her vaccination records at home. Her last pneumonia shot was 03/2017. Pt will bring records when she comes to husband's appt on Monday.

## 2024-02-24 NOTE — Progress Notes (Signed)
 New Patient Office Visit  Subjective    Patient ID: Courtney Galloway, female    DOB: 1950-08-29  Age: 73 y.o. MRN: [id number]  CC:  Chief Complaint  Patient presents with   Establish Care    HPI Courtney Galloway presents to establish care.  Discussed the use of AI scribe software for clinical note transcription with the patient, who gave verbal consent to proceed.  History of Present Illness Courtney Galloway is a 73 year old female who presents for an establishment of care visit.  She is focused on maintaining her medication regimen for hypothyroidism, managed with levothyroxine. Recent labs show a TSH of 0.163, slightly lower than previous levels. She takes 75 mcg of levothyroxine five days a week and halves the dose two days a week. She experiences occasional fatigue and sluggishness. Her daughter has Hashimoto's disease, but she has not been tested for it.  She takes Protonix for acid reflux, initially prescribed after an endoscopy revealed erosions in her throat. She takes 20 mg once daily, although the prescription is for 40 mg twice daily. She sometimes forgets to take it and questions the necessity of daily use.  She alternates between Zyrtec and Claritin for allergies, currently taking Zyrtec.  She underwent cataract surgery last year, resulting in improved distance vision but requires glasses for reading.  She has received COVID-19 vaccinations, annual flu shots, and a shingles vaccine. She is unsure about the new pneumonia vaccine, Prevnar 20, and will verify her immunization records.   Hypothyroidism: -Medications: Levothyroxine 75 mcg 5 days a week, 1/2 tablet Wednesday/Saturday  -Patient is compliant with the above medication (s) at the above dose and reports no medication side effects.  -Denies weight changes, cold./heat intolerance, skin changes, anxiety/palpitations  -Last TSH: 5/25 0.163, T4 1.56  GERD: -Currently on Protonix 40 mg PRN  Allergic  Rhinitis: -Currently taking Zyrtec and alternating yearly   Health Maintenance: -Blood work UTD -Mammogram due -Colon cancer screening: completed  Outpatient Encounter Medications as of 02/24/2024  Medication Sig   cetirizine (ALLERGY, CETIRIZINE,) 10 MG tablet Take 10 mg by mouth daily.   levothyroxine (SYNTHROID) 75 MCG tablet Take 75 mcg by mouth daily before breakfast.   LUMIGAN 0.01 % SOLN Place 1 drop into the left eye at bedtime.   Magnesium 250 MG TABS Take by mouth.   Multiple Vitamin (MULTI-VITAMINS) TABS Take by mouth.   pantoprazole (PROTONIX) 40 MG tablet Take by mouth.   Red Yeast Rice Extract 600 MG CAPS Take by mouth.   No facility-administered encounter medications on file as of 02/24/2024.    Past Medical History:  Diagnosis Date   GERD (gastroesophageal reflux disease)    Glaucoma    Mild mitral regurgitation by prior echocardiogram    Mild tricuspid regurgitation by prior echocardiogram    PVC's (premature ventricular contractions)     Past Surgical History:  Procedure Laterality Date   CATARACT EXTRACTION W/PHACO Left 12/22/2022   Procedure: CATARACT EXTRACTION PHACO AND INTRAOCULAR LENS PLACEMENT (IOC) LEFT CLAREON TORIC  6.58  00:38.1;  Surgeon: Jaye Fallow, MD;  Location: [facility];  Service: Ophthalmology;  Laterality: Left;   CATARACT EXTRACTION W/PHACO Right 01/05/2023   Procedure: CATARACT EXTRACTION PHACO AND INTRAOCULAR LENS PLACEMENT (IOC) RIGHT CLAREON TORIC 6.58 00:39.8;  Surgeon: Jaye Fallow, MD;  Location[facility];  Service: Ophthalmology;  Laterality: Right;    Family History  Problem Relation Age of Onset   Hyperlipidemia Mother    Arrhythmia Father  Stroke Father    Atrial fibrillation Father    Diabetes Sister    Hodgkin's lymphoma Sister    Cancer - Lung Sister     Social History   Socioeconomic History   Marital status: Married    Spouse name: Not on file   Number of children: Not on file    Years of education: Not on file   Highest education level: Bachelor's degree (e.g., BA, AB, BS)  Occupational History   Not on file  Tobacco Use   Smoking status: Never   Smokeless tobacco: Not on file  Vaping Use   Vaping status: Never Used  Substance and Sexual Activity   Alcohol use: Not Currently   Drug use: Never   Sexual activity: Not on file  Other Topics Concern   Not on file  Social History Narrative   Not on file   Social Drivers of Health   Financial Resource Strain: Low Risk  (02/20/2024)   Overall Financial Resource Strain (CARDIA)    Difficulty of Paying Living Expenses: Not hard at all  Food Insecurity: No Food Insecurity (02/20/2024)   Hunger Vital Sign    Worried About Running Out of Food in the Last Year: Never true    Ran Out of Food in the Last Year: Never true  Transportation Needs: No Transportation Needs (02/20/2024)   PRAPARE - Administrator, Civil Service (Medical): No    Lack of Transportation (Non-Medical): No  Physical Activity: Sufficiently Active (02/20/2024)   Exercise Vital Sign    Days of Exercise per Week: 7 days    Minutes of Exercise per Session: 120 min  Stress: No Stress Concern Present (02/20/2024)   [employer] of Occupational Health - Occupational Stress Questionnaire    Feeling of Stress: Not at all  Social Connections: Unknown (02/20/2024)   Social Connection and Isolation Panel    Frequency of Communication with Friends and Family: Patient declined    Frequency of Social Gatherings with Friends and Family: Patient declined    Attends Religious Services: Patient declined    Database administrator or Organizations: No    Attends Engineer, structural: Not on file    Marital Status: Married  Catering manager Violence: Not on file    Review of Systems  All other systems reviewed and are negative.       Objective    BP 132/78   Pulse 87   Temp 98.4 F (36.9 C) (Oral)   Resp 16   Ht 5' 3 (1.6 m)    Wt 143 lb 3.2 oz (65 kg)   SpO2 99%   BMI 25.37 kg/m   Physical Exam Constitutional:      Appearance: Normal appearance.  HENT:     Head: Normocephalic and atraumatic.     Mouth/Throat:     Mouth: Mucous membranes are moist.     Pharynx: Oropharynx is clear.  Eyes:     Extraocular Movements: Extraocular movements intact.     Conjunctiva/sclera: Conjunctivae normal.     Pupils: Pupils are equal, round, and reactive to light.  Neck:     Comments: No thyromegaly Cardiovascular:     Rate and Rhythm: Normal rate and regular rhythm.  Pulmonary:     Effort: Pulmonary effort is normal.     Breath sounds: Normal breath sounds.  Musculoskeletal:     Cervical back: No tenderness.     Right lower leg: No edema.     Left lower  leg: No edema.  Lymphadenopathy:     Cervical: No cervical adenopathy.  Skin:    General: Skin is warm and dry.  Neurological:     General: No focal deficit present.     Mental Status: She is alert. Mental status is at baseline.  Psychiatric:        Mood and Affect: Mood normal.        Behavior: Behavior normal.         Assessment & Plan:   Assessment & Plan Hypothyroidism Chronic hypothyroidism with fluctuating TSH levels, recent low TSH suggests possible overmedication. T4 levels stable. Family history of thyroid issues. Sensitive to dosing changes. - Continue levothyroxine 75 mcg five days a week, half dose two days a week. - Monitor TSH and T4 in six months. - Consider Hashimoto's testing during next labs. - Adjust levothyroxine if TSH drops further.  Gastroesophageal reflux disease (GERD) GERD managed with Protonix. Currently taking 20 mg daily instead of prescribed 40 mg. No recent reflux symptoms. Long-term Protonix use may affect nutrient absorption and increase osteoporosis risk. - Continue Protonix 20 mg as needed. - Check vitamin D and B12 levels during next labs.  Allergic rhinitis Managed with alternating Zyrtec and Claritin  annually. Currently on Zyrtec. - Continue alternating Zyrtec and Claritin annually.  - pantoprazole (PROTONIX) 20 MG tablet; Take 1 tablet (20 mg total) by mouth 2 (two) times daily. - MM 3D SCREENING MAMMOGRAM BILATERAL BREAST; Future   Return in about 6 months (around 08/26/2024).   Sharyle Fischer, DO

## 2024-02-28 ENCOUNTER — Ambulatory Visit (INDEPENDENT_AMBULATORY_CARE_PROVIDER_SITE_OTHER): Admitting: Emergency Medicine

## 2024-02-28 ENCOUNTER — Other Ambulatory Visit: Payer: Self-pay | Admitting: Internal Medicine

## 2024-02-28 DIAGNOSIS — Z23 Encounter for immunization: Secondary | ICD-10-CM | POA: Diagnosis not present

## 2024-02-28 DIAGNOSIS — E039 Hypothyroidism, unspecified: Secondary | ICD-10-CM

## 2024-02-28 MED ORDER — LEVOTHYROXINE SODIUM 75 MCG PO TABS: 75.0000 ug | ORAL_TABLET | Freq: Every day | ORAL | 1 refills | Status: AC

## 2024-03-01 LAB — HM MAMMOGRAPHY

## 2024-03-08 ENCOUNTER — Other Ambulatory Visit (HOSPITAL_COMMUNITY): Payer: Self-pay

## 2024-03-09 ENCOUNTER — Encounter: Payer: Self-pay | Admitting: Internal Medicine

## 2024-04-04 ENCOUNTER — Ambulatory Visit (INDEPENDENT_AMBULATORY_CARE_PROVIDER_SITE_OTHER)

## 2024-04-04 DIAGNOSIS — Z23 Encounter for immunization: Secondary | ICD-10-CM | POA: Diagnosis not present

## 2024-04-12 NOTE — Progress Notes (Unsigned)
 Cardiology Office Note   Date:  04/13/2024  ID:  Courtney Galloway, DOB 1950-09-25, MRN [id number] PCP: Bernardo Fend, DO  [facility] Providers Cardiologist:  None     History of Present Illness Courtney Galloway is a 73 y.o. female with a past medical history of PVCs, gastroesophageal reflux disease, mild mitral regurgitation and mild tricuspid regurgitation on echocardiogram, who is here today for follow-up.  She had a prior echocardiogram completed in 2018 that revealed an LVEF of 50-55% with mild MR.  She had previously been followed by [facility] cardiology Dr. Bosie.  She had a longstanding history of palpitations and had a cardiac monitor in 2022 that showed frequent PVCs with no sustained arrhythmias.  She was last seen in clinic 01/12/2024 by Dr. Darliss to establish care.  She has had occasional palpitations especially when she sleeps.  Denies any chest pain or shortness of breath.  She was to be placed on a cardiac monitor to evaluate PVC burden or any other significant arrhythmias and obtain an echocardiogram.  She returns clinic today stating overall for the cardiac perspective that she has been doing well.  She continues to have palpitations and what she feels is noted to be PVCs.  She states that she does a lot of running as her husband has atrial fibrillation and has several doctors appointments.  States that she has been compliant with her current medication regimen.  States that she feels as though some of her symptoms improved when she started treatment for her thyroid.  Denies any hospitalizations or visits to the emergency department.  ROS: 10 point review of systems has been reviewed and considered negative with the exception was been as listed in the HPI  Studies Reviewed EKG Interpretation Date/Time:  Thursday April 13 2024 08:25:01 EDT Ventricular Rate:  66 PR Interval:  170 QRS Duration:  132 QT Interval:  416 QTC Calculation: 436 R Axis:   -44  Text  Interpretation: Normal sinus rhythm Left axis deviation Left bundle branch block When compared with ECG of 12-Jan-2024 11:10, No significant change was found Confirmed by Gerard Frederick ([id number]) on 04/13/2024 8:30:33 AM    2d echo 02/16/2024 1. Left ventricular ejection fraction, by estimation, is 40 to 45%. The  left ventricle has mildly decreased function. The left ventricle  demonstrates regional wall motion abnormalities (hypokinesis of the  anterior and anteroseptal wall). Left  ventricular diastolic parameters are consistent with Grade I diastolic  dysfunction (impaired relaxation). The average left ventricular global  longitudinal strain is -17.8 %. The global longitudinal strain is normal.   2. Right ventricular systolic function is normal. The right ventricular  size is normal.   3. The mitral valve is normal in structure. Mild mitral valve  regurgitation. No evidence of mitral stenosis.   4. Tricuspid valve regurgitation is moderate.   5. The aortic valve is normal in structure. Aortic valve regurgitation is  not visualized. No aortic stenosis is present.   Event Monitor (Zio) 01/27/2024 Conclusion Average heart rate 74, range 46-124. 1 episode of nonsustained SVT lasting 9 beats. Rare PVCs, rare PACs. No atrial fibrillation or atrial flutter. No sustained arrhythmias. Risk Assessment/Calculations           Physical Exam VS:  BP 128/70 (BP Location: Left Arm, Patient Position: Sitting, Cuff Size: Normal)   Pulse 66   Ht 5' 3 (1.6 m)   Wt 142 lb 12.8 oz (64.8 kg)   SpO2 98%   BMI 25.30 kg/m  Wt Readings from Last 3 Encounters:  04/13/24 142 lb 12.8 oz (64.8 kg)  02/24/24 143 lb 3.2 oz (65 kg)  01/12/24 140 lb 4 oz (63.6 kg)    GEN: Well nourished, well developed in no acute distress NECK: No JVD; No carotid bruits CARDIAC: RRR, no murmurs, rubs, gallops RESPIRATORY:  Clear to auscultation without rales, wheezing or rhonchi  ABDOMEN: Soft, non-tender,  non-distended EXTREMITIES:  No edema; No deformity   ASSESSMENT AND PLAN 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  Prior echocardiogram with LVEF 50-55% in 2018.  With recent drop in LVEF noted on echocardiogram in 2 left bundle branch block noted on EKGs back in July she is being scheduled for coronary CTA to rule out any ischemic causes.  EKG today reveals sinus rhythm with rate of 66 with left bundle branch block.  She is being sent for BMP today.   PVCs with ongoing palpitations.  Per our cardiac monitors [place] patient previously been advised she would likely not tolerate beta-blocker therapy for suppression due to lower blood pressure.  Recent ZIO XT monitor revealed an average heart rate of 74 bpm with 1 episode of nonsustained SVT lasting 9 beats with rare PVCs and rare PACs no atrial fibrillation or atrial flutter was noted and no sustained arrhythmias.  Mild MR and moderate TR recently noted on echocardiogram.  Will continue to monitor with surveillance studies.  Hypothyroidism where she is continued on levothyroxine  75 mcg daily.  Ongoing management per PCP.  GERD continued on Protonix 20 mg twice daily.       Dispo: Patient to return to clinic to see MD/APP in 3 months or sooner if needed for further evaluation  Signed, Samba Cumba, NP

## 2024-04-13 ENCOUNTER — Encounter: Payer: Self-pay | Admitting: Cardiology

## 2024-04-13 ENCOUNTER — Ambulatory Visit: Attending: Cardiology | Admitting: Cardiology

## 2024-04-13 VITALS — BP 128/70 | HR 66 | Ht 63.0 in | Wt 142.8 lb

## 2024-04-13 DIAGNOSIS — E039 Hypothyroidism, unspecified: Secondary | ICD-10-CM | POA: Diagnosis not present

## 2024-04-13 DIAGNOSIS — I5020 Unspecified systolic (congestive) heart failure: Secondary | ICD-10-CM

## 2024-04-13 DIAGNOSIS — K219 Gastro-esophageal reflux disease without esophagitis: Secondary | ICD-10-CM

## 2024-04-13 DIAGNOSIS — I447 Left bundle-branch block, unspecified: Secondary | ICD-10-CM

## 2024-04-13 DIAGNOSIS — R002 Palpitations: Secondary | ICD-10-CM

## 2024-04-13 DIAGNOSIS — I493 Ventricular premature depolarization: Secondary | ICD-10-CM | POA: Diagnosis not present

## 2024-04-13 MED ORDER — METOPROLOL TARTRATE 50 MG PO TABS: ORAL_TABLET | ORAL | 0 refills | Status: DC

## 2024-04-13 NOTE — Patient Instructions (Addendum)
 Medication Instructions:  Your physician recommends that you continue on your current medications as directed. Please refer to the Current Medication list given to you today.    *If you need a refill on your cardiac medications before your next appointment, please call your pharmacy*  Lab Work: Your provider would like for you to have following labs drawn today BMP.   If you have labs (blood work) drawn today and your tests are completely normal, you will receive your results only by: MyChart Message (if you have MyChart) OR A paper copy in the mail If you have any lab test that is abnormal or we need to change your treatment, we will call you to review the results.  Testing/Procedures:   Your cardiac CT will be scheduled at one of the below locations:   [facility] [address] [place], [place] [address] [phone]  If scheduled at [facility], please arrive to the Heart and Vascular Center 15 mins early for check-in and test prep.  There is spacious parking and easy access to the radiology department from the [facility] entrance. Please enter here and check-in with the desk attendant.   Please follow these instructions carefully (unless otherwise directed):  An IV will be required for this test and Nitroglycerin will be given.  Hold all erectile dysfunction medications at least 3 days (72 hrs) prior to test. (Ie viagra, cialis, sildenafil, tadalafil, etc)   On the Night Before the Test: Be sure to Drink plenty of water. Do not consume any caffeinated/decaffeinated beverages or chocolate 12 hours prior to your test. Do not take any antihistamines 12 hours prior to your test.  On the Day of the Test: Drink plenty of water until 1 hour prior to the test. Do not eat any food 1 hour prior to test. You may take your regular medications prior to the test.  Take metoprolol (Lopressor) two hours prior to test. If you  take Furosemide/Hydrochlorothiazide/Spironolactone/Chlorthalidone, please HOLD on the morning of the test. Patients who wear a continuous glucose monitor MUST remove the device prior to scanning. FEMALES- please wear underwire-free bra if available, avoid dresses & tight clothing       After the Test: Drink plenty of water. After receiving IV contrast, you may experience a mild flushed feeling. This is normal. On occasion, you may experience a mild rash up to 24 hours after the test. This is not dangerous. If this occurs, you can take Benadryl 25 mg, Zyrtec, Claritin, or Allegra and increase your fluid intake. (Patients taking Tikosyn should avoid Benadryl, and may take Zyrtec, Claritin, or Allegra) If you experience trouble breathing, this can be serious. If it is severe call 911 IMMEDIATELY. If it is mild, please call our office.  We will call to schedule your test 2-4 weeks out understanding that some insurance companies will need an authorization prior to the service being performed.   For more information and frequently asked questions, please visit our website : [email or web address]  For non-scheduling related questions, please contact the cardiac imaging nurse navigator should you have any questions/concerns: Cardiac Imaging Nurse Navigators Direct Office Dial: [phone]   For scheduling needs, including cancellations and rescheduling, please call [place], [phone].   Follow-Up: At [facility], you and your health needs are our priority.  As part of our continuing mission to provide you with exceptional heart care, our providers are all part of one team.  This team includes your primary Cardiologist (  physician) and Advanced Practice Providers or APPs (Physician Assistants and Nurse Practitioners) who all work together to provide you with the care you need, when you need it.  Your next appointment:   3 month(s)  Provider:   You may see one of the  following Advanced Practice Providers on your designated Care Team:   Lonni Meager, NP Lesley Maffucci, PA-C Bernardino Bring, PA-C Cadence [place]port, PA-C Tylene Lunch, NP Barnie Hila, NP

## 2024-04-14 LAB — BASIC METABOLIC PANEL WITH GFR
BUN/Creatinine Ratio: 14 (ref 12–28)
BUN: 14 mg/dL (ref 8–27)
CO2: 23 mmol/L (ref 20–29)
Calcium: 10 mg/dL (ref 8.7–10.3)
Chloride: 103 mmol/L (ref 96–106)
Creatinine, Ser: 0.97 mg/dL (ref 0.57–1.00)
Glucose: 100 mg/dL — ABNORMAL HIGH (ref 70–99)
Potassium: 4.7 mmol/L (ref 3.5–5.2)
Sodium: 142 mmol/L (ref 134–144)
eGFR: 62 mL/min/1.73 (ref >59)

## 2024-04-17 ENCOUNTER — Ambulatory Visit: Payer: Self-pay | Admitting: Cardiology

## 2024-04-17 NOTE — Progress Notes (Signed)
 Labs have remained stable.  No current changes needed to current medication regimen at this time

## 2024-05-02 ENCOUNTER — Encounter (HOSPITAL_COMMUNITY): Payer: Self-pay

## 2024-05-04 ENCOUNTER — Ambulatory Visit
Admission: RE | Admit: 2024-05-04 | Discharge: 2024-05-04 | Disposition: A | Discharge status: Home / Self Care | Source: Ambulatory Visit | Attending: Cardiology | Admitting: Cardiology

## 2024-05-04 DIAGNOSIS — I447 Left bundle-branch block, unspecified: Secondary | ICD-10-CM | POA: Insufficient documentation

## 2024-05-04 MED ORDER — IOHEXOL 350 MG/ML SOLN
100.0000 mL | Freq: Once | INTRAVENOUS | Status: AC | PRN
  Administered 2024-05-04: 100 mL via INTRAVENOUS

## 2024-05-04 MED ORDER — NITROGLYCERIN 0.4 MG SL SUBL
0.8000 mg | SUBLINGUAL_TABLET | Freq: Once | SUBLINGUAL | Status: AC
  Administered 2024-05-04: 0.8 mg via SUBLINGUAL
  Filled 2024-05-04: qty 25

## 2024-05-04 NOTE — Progress Notes (Signed)
 Patient tolerated CT well. Vital signs stable encourage to drink water throughout day.Reasons explained and verbalized understanding. Ambulated steady gait.

## 2024-05-07 NOTE — Progress Notes (Signed)
 No significant or abnormal findings on the chest CT portion of the coronary CTA.

## 2024-06-05 ENCOUNTER — Ambulatory Visit

## 2024-06-05 VITALS — Ht 63.0 in | Wt 142.0 lb

## 2024-06-05 DIAGNOSIS — Z0000 Encounter for general adult medical examination without abnormal findings: Secondary | ICD-10-CM | POA: Diagnosis not present

## 2024-06-05 NOTE — Patient Instructions (Signed)
 Courtney Galloway,  Thank you for taking the time for your Medicare Wellness Visit. I appreciate your continued commitment to your health goals. Please review the care plan we discussed, and feel free to reach out if I can assist you further.  Please note that Annual Wellness Visits do not include a physical exam. Some assessments may be limited, especially if the visit was conducted virtually. If needed, we may recommend an in-person follow-up with your provider.  Ongoing Care Seeing your primary care provider every 3 to 6 months helps us  monitor your health and provide consistent, personalized care. Next office visit on 11/29/2024.  Remember to discuss a Bone Density screening and a Hep C screening during your office visit with your provider.  Keep up the good work.    Referrals If a referral was made during today's visit and you haven't received any updates within two weeks, please contact the referred provider directly to check on the status.  Recommended Screenings:  Health Maintenance  Topic Date Due   Hepatitis C Screening  Never done   DTaP/Tdap/Td vaccine (1 - Tdap) 02/23/2025*   COVID-19 Vaccine (9 - 2025-26 season) 09/26/2024   Colon Cancer Screening  10/17/2024   Medicare Annual Wellness Visit  06/05/2025   Breast Cancer Screening  03/01/2026   Pneumococcal Vaccine for age over 33  Completed   Flu Shot  Completed   Osteoporosis screening with Bone Density Scan  Completed   Zoster (Shingles) Vaccine  Completed   Meningitis B Vaccine  Aged Out  *Topic was postponed. The date shown is not the original due date.       06/02/2024    9:07 AM  Advanced Directives  Does Patient Have a Medical Advance Directive? Yes  Type of Estate Agent of [place];Living will;Out of facility DNR (pink MOST or yellow form)    Vision: Annual vision screenings are recommended for early detection of glaucoma, cataracts, and diabetic retinopathy. These exams can also reveal signs  of chronic conditions such as diabetes and high blood pressure.  Dental: Annual dental screenings help detect early signs of oral cancer, gum disease, and other conditions linked to overall health, including heart disease and diabetes.  Please see the attached documents for additional preventive care recommendations.

## 2024-06-05 NOTE — Progress Notes (Signed)
 Chief Complaint  Patient presents with   Medicare Wellness     Subjective:   Courtney Galloway is a 73 y.o. female who presents for a Medicare Annual Wellness Visit.  Allergies (verified) Amoxicillin   History: Past Medical History:  Diagnosis Date   GERD (gastroesophageal reflux disease)    Glaucoma    Mild mitral regurgitation by prior echocardiogram    Mild tricuspid regurgitation by prior echocardiogram    PVC's (premature ventricular contractions)    Past Surgical History:  Procedure Laterality Date   CATARACT EXTRACTION W/PHACO Left 12/22/2022   Procedure: CATARACT EXTRACTION PHACO AND INTRAOCULAR LENS PLACEMENT (IOC) LEFT CLAREON TORIC  6.58  00:38.1;  Surgeon: Jaye Fallow, MD;  Location: [facility];  Service: Ophthalmology;  Laterality: Left;   CATARACT EXTRACTION W/PHACO Right 01/05/2023   Procedure: CATARACT EXTRACTION PHACO AND INTRAOCULAR LENS PLACEMENT (IOC) RIGHT CLAREON TORIC 6.58 00:39.8;  Surgeon: Jaye Fallow, MD;  Location[facility];  Service: Ophthalmology;  Laterality: Right;   Family History  Problem Relation Age of Onset   Hyperlipidemia Mother    Arrhythmia Father    Stroke Father    Atrial fibrillation Father    Diabetes Sister    Hodgkin's lymphoma Sister    Cancer - Lung Sister    Social History   Occupational History   Occupation: RETIRED  Tobacco Use   Smoking status: Never   Smokeless tobacco: Not on file  Vaping Use   Vaping status: Never Used  Substance and Sexual Activity   Alcohol use: Not Currently   Drug use: Never   Sexual activity: Not on file   Tobacco Counseling Counseling given: Not Answered  SDOH Screenings   Food Insecurity: No Food Insecurity (06/02/2024)  Housing: Low Risk  (06/02/2024)  Transportation Needs: No Transportation Needs (06/02/2024)  Utilities: Not At Risk (06/05/2024)  Alcohol Screen: Low Risk  (06/02/2024)  Depression (PHQ2-9): Low Risk  (06/05/2024)  Financial  Resource Strain: Low Risk  (06/02/2024)  Physical Activity: Sufficiently Active (06/02/2024)  Social Connections: Unknown (06/02/2024)  Stress: No Stress Concern Present (06/02/2024)  Tobacco Use: Unknown (06/05/2024)  Health Literacy: Adequate Health Literacy (06/05/2024)   See flowsheets for full screening details  Depression Screen PHQ 2 & 9 Depression Scale- Over the past 2 weeks, how often have you been bothered by any of the following problems? Little interest or pleasure in doing things: 0 Feeling down, depressed, or hopeless (PHQ Adolescent also includes...irritable): 0 PHQ-2 Total Score: 0 Trouble falling or staying asleep, or sleeping too much: 0 Feeling tired or having little energy: 0 Poor appetite or overeating (PHQ Adolescent also includes...weight loss): 0 Feeling bad about yourself - or that you are a failure or have let yourself or your family down: 0 Trouble concentrating on things, such as reading the newspaper or watching television (PHQ Adolescent also includes...like school work): 0 Moving or speaking so slowly that other people could have noticed. Or the opposite - being so fidgety or restless that you have been moving around a lot more than usual: 0 Thoughts that you would be better off dead, or of hurting yourself in some way: 0 PHQ-9 Total Score: 0 If you checked off any problems, how difficult have these problems made it for you to do your work, take care of things at home, or get along with other people?: Not difficult at all  Depression Treatment Depression Interventions/Treatment : EYV7-0 Score <4 Follow-up Not Indicated     Goals Addressed  This Visit's Progress    Patient Stated       To keep going/2025       Visit info / Clinical Intake: Medicare Wellness Visit Type:: Initial Annual Wellness Visit Persons participating in visit:: patient Medicare Wellness Visit Mode:: Video Because this visit was a virtual/telehealth visit::  unable to obtan vitals due to lack of equipment If Telephone or Video please confirm:: I connected with the patient using audio enabled telemedicine application and verified that I am speaking with the correct person using two identifiers; I discussed the limitations of evaluation and management by telemedicine; The patient expressed understanding and agreed to proceed Patient Location:: Home Provider Location:: Home Information given by:: patient Interpreter Needed?: No Pre-visit prep was completed: yes AWV questionnaire completed by patient prior to visit?: yes Date:: 06/02/24 Living arrangements:: lives with spouse/significant other Patient's Overall Health Status Rating: good Typical amount of pain: none Does pain affect daily life?: no Are you currently prescribed opioids?: no  Dietary Habits and Nutritional Risks How many meals a day?: (!) 1 Eats fruit and vegetables daily?: yes Most meals are obtained by: preparing own meals In the last 2 weeks, have you had any of the following?: none Diabetic:: no  Functional Status Activities of Daily Living (to include ambulation/medication): (Patient-Rptd) Independent Ambulation: (Patient-Rptd) Independent Home Management: (Patient-Rptd) Independent Manage your own finances?: yes Primary transportation is: driving Concerns about vision?: no *vision screening is required for WTM* Concerns about hearing?: (!) yes Uses hearing aids?: (!) yes Hear whispered voice?: yes  Fall Screening Falls in the past year?: 0 Number of falls in past year: 0 Was there an injury with Fall?: 0 Fall Risk Category Calculator: 0 Patient Fall Risk Level: Low Fall Risk  Fall Risk Patient at Risk for Falls Due to: No Fall Risks Fall risk Follow up: Falls evaluation completed; Falls prevention discussed  Home and Transportation Safety: All rugs have non-skid backing?: N/A, no rugs All stairs or steps have railings?: yes Grab bars in the bathtub or  shower?: yes Have non-skid surface in bathtub or shower?: yes Good home lighting?: yes Regular seat belt use?: yes Hospital stays in the last year:: no  Cognitive Assessment Difficulty concentrating, remembering, or making decisions? : no Will 6CIT or Mini Cog be Completed: no 6CIT or Mini Cog Declined: patient alert, oriented, able to answer questions appropriately and recall recent events  Advance Directives (For Healthcare) Does Patient Have a Medical Advance Directive?: Yes Type of Advance Directive: Healthcare Power of [place]; Living will; Out of facility DNR (pink MOST or yellow form)  Reviewed/Updated  Reviewed/Updated: Reviewed All (Medical, Surgical, Family, Medications, Allergies, Care Teams, Patient Goals)        Objective:    Today's Vitals   06/05/24 0931  Weight: 142 lb (64.4 kg)  Height: 5' 3 (1.6 m)   Body mass index is 25.15 kg/m.  Current Medications (verified) Outpatient Encounter Medications as of 06/05/2024  Medication Sig   cetirizine (ALLERGY, CETIRIZINE,) 10 MG tablet Take 10 mg by mouth daily.   levothyroxine  (SYNTHROID ) 75 MCG tablet Take 1 tablet (75 mcg total) by mouth daily before breakfast.   LUMIGAN 0.01 % SOLN Place 1 drop into the left eye at bedtime.   Magnesium 250 MG TABS Take by mouth.   Multiple Vitamin (MULTI-VITAMINS) TABS Take by mouth.   Red Yeast Rice Extract 600 MG CAPS Take by mouth.   metoprolol  tartrate (LOPRESSOR ) 50 MG tablet TAKE 1 TABLET 2 HR PRIOR TO CARDIAC PROCEDURE  pantoprazole (PROTONIX) 20 MG tablet Take 1 tablet (20 mg total) by mouth 2 (two) times daily. (Patient not taking: Reported on 04/13/2024)   No facility-administered encounter medications on file as of 06/05/2024.   Hearing/Vision screen Hearing Screening - Comments:: Wears hearing aides Vision Screening - Comments:: Wears eyeglasses/[facility]/UTD Immunizations and Health Maintenance Health Maintenance  Topic Date Due   Hepatitis C Screening   Never done   DTaP/Tdap/Td (1 - Tdap) 02/23/2025 (Originally 01/31/1970)   COVID-19 Vaccine (9 - 2025-26 season) 09/26/2024   Colonoscopy  10/17/2024   Medicare Annual Wellness (AWV)  06/05/2025   Mammogram  03/01/2026   Pneumococcal Vaccine: 50+ Years  Completed   Influenza Vaccine  Completed   Bone Density Scan  Completed   Zoster Vaccines- Shingrix  Completed   Meningococcal B Vaccine  Aged Out        Assessment/Plan:  This is a routine wellness examination for [place].  Patient Care Team: Bernardo Fend, DO as PCP - General (Internal Medicine) Gerard Frederick, NP as Nurse Practitioner (Cardiology)  I have personally reviewed and noted the following in the patient's chart:   Medical and social history Use of alcohol, tobacco or illicit drugs  Current medications and supplements including opioid prescriptions. Functional ability and status Nutritional status Physical activity Advanced directives List of other physicians Hospitalizations, surgeries, and ER visits in previous 12 months Vitals Screenings to include cognitive, depression, and falls Referrals and appointments  No orders of the defined types were placed in this encounter.  In addition, I have reviewed and discussed with patient certain preventive protocols, quality metrics, and best practice recommendations. A written personalized care plan for preventive services as well as general preventive health recommendations were provided to patient.   Zoriana Oats L Adara Kittle, CMA   06/05/2024   Return in 1 year (on 06/05/2025).  After Visit Summary: (MyChart) Due to this being a telephonic visit, the after visit summary with patients personalized plan was offered to patient via MyChart   Nurse Notes: Patient's last DEXA was in 2021, please discuss with patient during next office visit.  She is also due for a Hep C screening per chart.  Patient had no other concerns to address today.

## 2024-07-14 ENCOUNTER — Ambulatory Visit: Attending: Cardiology | Admitting: Cardiology

## 2024-07-14 ENCOUNTER — Ambulatory Visit: Admitting: Cardiology

## 2024-07-14 ENCOUNTER — Encounter: Payer: Self-pay | Admitting: Cardiology

## 2024-07-14 VITALS — BP 140/70 | HR 66 | Ht 63.0 in | Wt 143.8 lb

## 2024-07-14 DIAGNOSIS — R002 Palpitations: Secondary | ICD-10-CM

## 2024-07-14 DIAGNOSIS — I5020 Unspecified systolic (congestive) heart failure: Secondary | ICD-10-CM | POA: Diagnosis not present

## 2024-07-14 DIAGNOSIS — K219 Gastro-esophageal reflux disease without esophagitis: Secondary | ICD-10-CM

## 2024-07-14 DIAGNOSIS — E039 Hypothyroidism, unspecified: Secondary | ICD-10-CM | POA: Diagnosis not present

## 2024-07-14 DIAGNOSIS — I447 Left bundle-branch block, unspecified: Secondary | ICD-10-CM | POA: Diagnosis not present

## 2024-07-14 DIAGNOSIS — R030 Elevated blood-pressure reading, without diagnosis of hypertension: Secondary | ICD-10-CM

## 2024-07-14 DIAGNOSIS — I2510 Atherosclerotic heart disease of native coronary artery without angina pectoris: Secondary | ICD-10-CM | POA: Diagnosis not present

## 2024-07-14 DIAGNOSIS — I493 Ventricular premature depolarization: Secondary | ICD-10-CM | POA: Diagnosis not present

## 2024-07-14 NOTE — Progress Notes (Signed)
 " Cardiology Office Note   Date:  07/14/2024  ID:  FREDERICA CHRESTMAN, DOB May 21, 1951, MRN [id number] PCP: Bernardo Fend, DO  [facility] Providers Cardiologist:  None Cardiology APP:  Gerard Frederick, NP     History of Present Illness RICHELL CORKER is a 74 y.o. female with a past medical history of PVCs, gastroesophageal reflux disease, mild mitral regurgitation and mild tricuspid regurgitation with echocardiogram, who is here today for follow-up.   She had a prior echocardiogram completed in 2018 that revealed an LVEF of 50-55% with mild MR.  She had previously been followed by [facility] cardiology Dr. Bosie.  She had a longstanding history of palpitations and had a cardiac monitor in 2022 that showed frequent PVCs with no sustained arrhythmias.   She was seen in clinic 01/12/2024 by Dr. Darliss to establish care.  She has had occasional palpitations especially when she sleeps.  Denies any chest pain or shortness of breath.  She was to be placed on a cardiac monitor to evaluate PVC burden or any other significant arrhythmias and obtain an echocardiogram.  She was last seen in clinic 04/13/2024 stating she had been doing overall well from a cardiac perspective.  She continued to have occasional palpitations which she feels as PVCs.  She states that she does a lot of running as her husband has atrial fibrillation and has several doctors appointments.  States she has been compliant with her current medication regimen.  She was scheduled for coronary CTA to rule out any ischemic issues.   She returns to clinic today stating overall from a cardiac perspective she has been doing well.  She was running a little behind today.  She states that she did well after her coronary CT.  She continues to have some occasional chest discomfort but this is fleeting and can be with rest or exertion.  She denies any other associated symptoms.  She states that she has been compliant with her current medication  regimen.  Denies any hospitalizations or visits to the emergency department.  ROS: 10 point review of system has been reviewed and considered negative except ones been listed in HPI  Studies Reviewed EKG Interpretation Date/Time:  Friday July 14 2024 15:23:39 EST Ventricular Rate:  66 PR Interval:  176 QRS Duration:  136 QT Interval:  416 QTC Calculation: 436 R Axis:   -42  Text Interpretation: Normal sinus rhythm Left axis deviation Left bundle branch block When compared with ECG of 13-Apr-2024 08:25, No significant change was found Confirmed by Gerard Frederick ([id number]) on 07/14/2024 3:40:12 PM    cCTA 05/04/2024 IMPRESSION: 1. Coronary calcium score of 8.83. This was 41st percentile for age and sex matched control.   2. Normal coronary origin with right dominance.   3. Minimal LAD calcification (<25%).   4. CAD-RADS 1. Minimal non-obstructive CAD (0-24%). Consider preventive therapy and risk factor modification.  2d echo 02/16/2024 1. Left ventricular ejection fraction, by estimation, is 40 to 45%. The  left ventricle has mildly decreased function. The left ventricle  demonstrates regional wall motion abnormalities (hypokinesis of the  anterior and anteroseptal wall). Left  ventricular diastolic parameters are consistent with Grade I diastolic  dysfunction (impaired relaxation). The average left ventricular global  longitudinal strain is -17.8 %. The global longitudinal strain is normal.   2. Right ventricular systolic function is normal. The right ventricular  size is normal.   3. The mitral valve is normal in structure. Mild mitral valve  regurgitation. No evidence  of mitral stenosis.   4. Tricuspid valve regurgitation is moderate.   5. The aortic valve is normal in structure. Aortic valve regurgitation is  not visualized. No aortic stenosis is present.    Event Monitor (Zio) 01/27/2024 Conclusion Average heart rate 74, range 46-124. 1 episode of nonsustained SVT  lasting 9 beats. Rare PVCs, rare PACs. No atrial fibrillation or atrial flutter. No sustained arrhythmias.  Risk Assessment/Calculations     Physical Exam VS:  BP (!) 140/70 (BP Location: Left Arm, Patient Position: Sitting, Cuff Size: Normal)   Pulse 66   Ht 5' 3 (1.6 m)   Wt 143 lb 12.8 oz (65.2 kg)   SpO2 98%   BMI 25.47 kg/m        Wt Readings from Last 3 Encounters:  07/14/24 143 lb 12.8 oz (65.2 kg)  06/05/24 142 lb (64.4 kg)  04/13/24 142 lb 12.8 oz (64.8 kg)    GEN: Well nourished, well developed in no acute distress NECK: No JVD; No carotid bruits CARDIAC: RRR, no murmurs, rubs, gallops RESPIRATORY:  Clear to auscultation without rales, wheezing or rhonchi  ABDOMEN: Soft, non-tender, non-distended EXTREMITIES:  No edema; No deformity   ASSESSMENT AND PLAN 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  Prior echocardiogram with LVEF 50-55% 2018.  Recent open LVEF noted on echocardiogram was likely related to left bundle branch block noted on EKG back in July she was scheduled for coronary CTA to rule out any ischemic causes.  Coronary calcium score of 8.83 which she has minimal LAD calcification less than 25%.  She appears to be euvolemic on exam.  Will discuss additional medications at return appointment.  Coronary calcification revealing minimal nonobstructive coronary artery disease with a calcium score 8.83 which is 41st percentile for age and sex matched control, minimal LAD calcification of less than 25%.  No ischemic changes noted on EKG today.  She is currently on red yeast rice with the last LDL checked 11 years ago.  Upcoming labs scheduled with PCP to reevaluate cholesterol.  Palpitations with PVCs that is still occasional.  Prior cardiac monitor was completed which revealed an average heart rate of 74 bpm and 1 episode of nonsustained SVT lasting 9 beats with rare PVCs and rare PACs with no atrial fibrillation or  atrial flutter.  In she was advised previously she would likely not tolerate beta-blocker therapy due to lower heart rates and being symptomatic.  As she only has palpitations on occasion and feels PVCs on occasion will defer any medications to reduce the PVC.  She continues to complain of worsening palpitations will consider repeating ZIO monitor to reassess PVC burden.  Mild MR and moderate TR recently noted on echocardiogram.  Will continue to monitor with surveillance studies  Hypothyroidism which she is continued on levothyroxine  with ongoing management per PCP.  Gastroesophageal reflux disease which she has been on PPI therapy.  Elevated blood pressure without diagnosis of hypertension with a blood pressure today of 140/70.  Previous chart review reveals blood pressures in the 120s and lower.  Will continue to monitor.  Encouraged to monitor pressures at home as well.       Dispo: Patient to  return to clinic as MD/APP in 6 months or sooner if needed for further evaluation.  Signed, Edman Lipsey, NP   "

## 2024-07-14 NOTE — Patient Instructions (Signed)
 Medication Instructions:  Your physician recommends the following medication changes.  STOP TAKING: Lopressor   Protonix  *If you need a refill on your cardiac medications before your next appointment, please call your pharmacy*  Lab Work: No labs ordered today  If you have labs (blood work) drawn today and your tests are completely normal, you will receive your results only by: MyChart Message (if you have MyChart) OR A paper copy in the mail If you have any lab test that is abnormal or we need to change your treatment, we will call you to review the results.  Testing/Procedures: No test ordered today   Follow-Up: At [facility], you and your health needs are our priority.  As part of our continuing mission to provide you with exceptional heart care, our providers are all part of one team.  This team includes your primary Cardiologist (physician) and Advanced Practice Providers or APPs (Physician Assistants and Nurse Practitioners) who all work together to provide you with the care you need, when you need it.  Your next appointment:   6 month(s)  Provider:   Tylene Lunch, NP

## 2024-11-29 ENCOUNTER — Encounter: Admitting: Internal Medicine
